# Patient Record
Sex: Male | Born: 1955 | Race: White | Hispanic: No | Smoking: Current some day smoker
Health system: Southern US, Community
[De-identification: ages and names within clinical notes are randomized; demographics above are authoritative.]

## PROBLEM LIST (undated history)

## (undated) DIAGNOSIS — E785 Hyperlipidemia, unspecified: Secondary | ICD-10-CM

## (undated) DIAGNOSIS — D696 Thrombocytopenia, unspecified: Secondary | ICD-10-CM

## (undated) DIAGNOSIS — M1990 Unspecified osteoarthritis, unspecified site: Secondary | ICD-10-CM

## (undated) DIAGNOSIS — F329 Major depressive disorder, single episode, unspecified: Secondary | ICD-10-CM

## (undated) DIAGNOSIS — Z972 Presence of dental prosthetic device (complete) (partial): Secondary | ICD-10-CM

## (undated) DIAGNOSIS — F32A Depression, unspecified: Secondary | ICD-10-CM

## (undated) DIAGNOSIS — F419 Anxiety disorder, unspecified: Secondary | ICD-10-CM

## (undated) DIAGNOSIS — B019 Varicella without complication: Secondary | ICD-10-CM

## (undated) DIAGNOSIS — T7840XA Allergy, unspecified, initial encounter: Secondary | ICD-10-CM

## (undated) DIAGNOSIS — H269 Unspecified cataract: Secondary | ICD-10-CM

## (undated) HISTORY — DX: Unspecified osteoarthritis, unspecified site: M19.90

## (undated) HISTORY — PX: NO PAST SURGERIES: SHX2092

## (undated) HISTORY — DX: Anxiety disorder, unspecified: F41.9

## (undated) HISTORY — DX: Unspecified cataract: H26.9

## (undated) HISTORY — DX: Depression, unspecified: F32.A

## (undated) HISTORY — PX: APPENDECTOMY: SHX54

## (undated) HISTORY — DX: Thrombocytopenia, unspecified: D69.6

## (undated) HISTORY — DX: Hyperlipidemia, unspecified: E78.5

## (undated) HISTORY — DX: Allergy, unspecified, initial encounter: T78.40XA

## (undated) HISTORY — DX: Major depressive disorder, single episode, unspecified: F32.9

## (undated) HISTORY — DX: Varicella without complication: B01.9

---

## 2011-08-15 ENCOUNTER — Ambulatory Visit: Payer: Self-pay | Admitting: Internal Medicine

## 2015-09-01 ENCOUNTER — Other Ambulatory Visit: Payer: Self-pay

## 2015-09-28 ENCOUNTER — Other Ambulatory Visit: Payer: Self-pay | Admitting: Emergency Medicine

## 2015-09-28 DIAGNOSIS — E7800 Pure hypercholesterolemia, unspecified: Secondary | ICD-10-CM

## 2015-09-28 MED ORDER — SIMVASTATIN 10 MG PO TABS: 10.0000 mg | ORAL_TABLET | Freq: Every day | ORAL | Status: DC

## 2015-09-28 NOTE — Telephone Encounter (Signed)
I received a faxed medication request from [facility].  Please advise.  Thanks

## 2015-09-28 NOTE — Telephone Encounter (Signed)
Med refill for simvastatin

## 2015-10-27 ENCOUNTER — Encounter: Payer: Self-pay | Admitting: Physician Assistant

## 2015-10-27 ENCOUNTER — Ambulatory Visit: Payer: Self-pay | Admitting: Physician Assistant

## 2015-10-27 VITALS — BP 120/85 | HR 75 | Temp 98.0°F

## 2015-10-27 DIAGNOSIS — J018 Other acute sinusitis: Secondary | ICD-10-CM

## 2015-10-27 DIAGNOSIS — R7989 Other specified abnormal findings of blood chemistry: Secondary | ICD-10-CM

## 2015-10-27 DIAGNOSIS — E785 Hyperlipidemia, unspecified: Secondary | ICD-10-CM

## 2015-10-27 MED ORDER — FLUTICASONE PROPIONATE 50 MCG/ACT NA SUSP: 2.0000 | Freq: Every day | NASAL | Status: DC

## 2015-10-27 MED ORDER — PREDNISONE 10 MG PO TABS: 30.0000 mg | ORAL_TABLET | Freq: Every day | ORAL | Status: DC

## 2015-10-27 MED ORDER — AZITHROMYCIN 250 MG PO TABS: ORAL_TABLET | ORAL | Status: DC

## 2015-10-27 NOTE — Progress Notes (Signed)
S: C/o runny nose and congestion for 3 days, no fever, chills, cp/sob, v/d; mucus is green and thick, cough is sporadic, c/o of facial and dental pain.   Using otc meds:   O: PE: perrl eomi, normocephalic, tms dull, nasal mucosa red and swollen, throat injected, neck supple no lymph, lungs c t a, cv rrr, neuro intact  A:  Acute sinusitis   P: zpack, flonase, prednisone 30mg  qd x 3d, drink fluids, continue regular meds , use otc meds of choice, return if not improving in 5 days, return earlier if worsening

## 2015-10-27 NOTE — Addendum Note (Signed)
Addended by: Rudene Anda T on: 10/27/2015 02:13 PM   Modules accepted: Orders

## 2015-10-28 ENCOUNTER — Encounter: Payer: Self-pay | Admitting: Emergency Medicine

## 2015-10-28 ENCOUNTER — Other Ambulatory Visit: Payer: Self-pay | Admitting: Physician Assistant

## 2015-10-28 LAB — CBC WITH DIFFERENTIAL/PLATELET
Basophils Absolute: 0 10*3/uL (ref 0.0–0.2)
Basos: 0 %
EOS (ABSOLUTE): 0.2 10*3/uL (ref 0.0–0.4)
EOS: 2 %
HEMATOCRIT: 48.9 % (ref 37.5–51.0)
HEMOGLOBIN: 16.8 g/dL (ref 12.6–17.7)
IMMATURE GRANULOCYTES: 0 %
Immature Grans (Abs): 0 10*3/uL (ref 0.0–0.1)
LYMPHS ABS: 1.6 10*3/uL (ref 0.7–3.1)
Lymphs: 18 %
MCH: 33.7 pg — ABNORMAL HIGH (ref 26.6–33.0)
MCHC: 34.4 g/dL (ref 31.5–35.7)
MCV: 98 fL — ABNORMAL HIGH (ref 79–97)
MONOCYTES: 6 %
Monocytes Absolute: 0.5 10*3/uL (ref 0.1–0.9)
Neutrophils Absolute: 6.6 10*3/uL (ref 1.4–7.0)
Neutrophils: 74 %
Platelets: 138 10*3/uL — ABNORMAL LOW (ref 150–379)
RBC: 4.99 x10E6/uL (ref 4.14–5.80)
RDW: 13.7 % (ref 12.3–15.4)
WBC: 9 10*3/uL (ref 3.4–10.8)

## 2015-10-28 LAB — HEPATIC FUNCTION PANEL
ALT: 19 IU/L (ref 0–44)
AST: 17 IU/L (ref 0–40)
Albumin: 4.9 g/dL — ABNORMAL HIGH (ref 3.6–4.8)
Alkaline Phosphatase: 68 IU/L (ref 39–117)
BILIRUBIN, DIRECT: 0.13 mg/dL (ref 0.00–0.40)
Bilirubin Total: 0.6 mg/dL (ref 0.0–1.2)
TOTAL PROTEIN: 6.7 g/dL (ref 6.0–8.5)

## 2015-10-28 LAB — LIPID PANEL
CHOL/HDL RATIO: 5.8 ratio — AB (ref 0.0–5.0)
CHOLESTEROL TOTAL: 179 mg/dL (ref 100–199)
HDL: 31 mg/dL — ABNORMAL LOW (ref >39)
LDL Calculated: 97 mg/dL (ref 0–99)
Triglycerides: 253 mg/dL — ABNORMAL HIGH (ref 0–149)
VLDL Cholesterol Cal: 51 mg/dL — ABNORMAL HIGH (ref 5–40)

## 2015-10-28 LAB — TESTOSTERONE: Testosterone: 547 ng/dL (ref 348–1197)

## 2015-10-28 LAB — PSA: PROSTATE SPECIFIC AG, SERUM: 1.1 ng/mL (ref 0.0–4.0)

## 2015-10-28 MED ORDER — SIMVASTATIN 20 MG PO TABS: 20.0000 mg | ORAL_TABLET | Freq: Every day | ORAL | Status: DC

## 2015-10-28 NOTE — Progress Notes (Signed)
Information regarding cholesterol control was mailed to patient.

## 2015-12-16 ENCOUNTER — Telehealth: Payer: Self-pay | Admitting: Emergency Medicine

## 2015-12-16 NOTE — Telephone Encounter (Signed)
I spoke with patient's wife Jonathan Ingram), per patient's request of his appointment with the Hematologist at the [facility].  His appointment is scheduled for 12/22/2015 at 3pm.

## 2015-12-22 ENCOUNTER — Inpatient Hospital Stay: Payer: Self-pay

## 2016-01-05 ENCOUNTER — Inpatient Hospital Stay: Payer: Managed Care, Other (non HMO)

## 2016-01-05 ENCOUNTER — Encounter: Payer: Self-pay | Admitting: Internal Medicine

## 2016-01-05 ENCOUNTER — Inpatient Hospital Stay: Payer: Managed Care, Other (non HMO) | Attending: Internal Medicine | Admitting: Internal Medicine

## 2016-01-05 ENCOUNTER — Other Ambulatory Visit: Payer: Self-pay | Admitting: *Deleted

## 2016-01-05 VITALS — BP 137/83 | HR 59 | Temp 96.8°F | Resp 18 | Ht 69.0 in | Wt 190.5 lb

## 2016-01-05 DIAGNOSIS — D696 Thrombocytopenia, unspecified: Secondary | ICD-10-CM

## 2016-01-05 DIAGNOSIS — F17210 Nicotine dependence, cigarettes, uncomplicated: Secondary | ICD-10-CM | POA: Diagnosis not present

## 2016-01-05 DIAGNOSIS — E785 Hyperlipidemia, unspecified: Secondary | ICD-10-CM | POA: Diagnosis not present

## 2016-01-05 DIAGNOSIS — Z79899 Other long term (current) drug therapy: Secondary | ICD-10-CM | POA: Diagnosis not present

## 2016-01-05 DIAGNOSIS — E291 Testicular hypofunction: Secondary | ICD-10-CM | POA: Diagnosis not present

## 2016-01-05 LAB — COMPREHENSIVE METABOLIC PANEL WITH GFR
ALT: 23 U/L (ref 17–63)
AST: 20 U/L (ref 15–41)
Albumin: 4.6 g/dL (ref 3.5–5.0)
Alkaline Phosphatase: 65 U/L (ref 38–126)
Anion gap: 8 (ref 5–15)
BUN: 19 mg/dL (ref 6–20)
CO2: 27 mmol/L (ref 22–32)
Calcium: 9.1 mg/dL (ref 8.9–10.3)
Chloride: 101 mmol/L (ref 101–111)
Creatinine, Ser: 1.02 mg/dL (ref 0.61–1.24)
GFR calc Af Amer: >60 mL/min
GFR calc non Af Amer: >60 mL/min
Glucose, Bld: 97 mg/dL (ref 65–99)
Potassium: 4.2 mmol/L (ref 3.5–5.1)
Sodium: 136 mmol/L (ref 135–145)
Total Bilirubin: 0.9 mg/dL (ref 0.3–1.2)
Total Protein: 6.8 g/dL (ref 6.5–8.1)

## 2016-01-05 LAB — CBC WITH DIFFERENTIAL/PLATELET
Basophils Absolute: 0 10*3/uL (ref 0–0.1)
Basophils Relative: 1 %
Eosinophils Absolute: 0.2 10*3/uL (ref 0–0.7)
Eosinophils Relative: 4 %
HCT: 47.5 % (ref 40.0–52.0)
Hemoglobin: 16.6 g/dL (ref 13.0–18.0)
Lymphocytes Relative: 36 %
Lymphs Abs: 1.8 10*3/uL (ref 1.0–3.6)
MCH: 34.1 pg — ABNORMAL HIGH (ref 26.0–34.0)
MCHC: 35 g/dL (ref 32.0–36.0)
MCV: 97.6 fL (ref 80.0–100.0)
Monocytes Absolute: 0.4 10*3/uL (ref 0.2–1.0)
Monocytes Relative: 7 %
Neutro Abs: 2.6 10*3/uL (ref 1.4–6.5)
Neutrophils Relative %: 52 %
Platelets: 134 10*3/uL — ABNORMAL LOW (ref 150–440)
RBC: 4.86 MIL/uL (ref 4.40–5.90)
RDW: 13 % (ref 11.5–14.5)
WBC: 4.9 10*3/uL (ref 3.8–10.6)

## 2016-01-05 LAB — VITAMIN B12: Vitamin B-12: 436 pg/mL (ref 180–914)

## 2016-01-05 LAB — LACTATE DEHYDROGENASE: LDH: 124 U/L (ref 98–192)

## 2016-01-05 LAB — FOLATE: FOLATE: 18.9 ng/mL (ref >5.9)

## 2016-01-05 NOTE — Progress Notes (Signed)
[place] NOTE  Patient Care Team: Lynnell Jude, MD as PCP - General (Family Medicine)  CHIEF COMPLAINTS/PURPOSE OF CONSULTATION:  Thrombocytopenia  # NOV 2016- THROMBOCYTOPENIA- ~ platelets 138/normal white count hemoglobin [incidental]  HISTORY OF PRESENTING ILLNESS:  Jonathan Ingram 60 y.o.  male with no significant past medical history except for hyperlipidemia/low testosterone- is here to discuss low platelets that was found on incidental labs. Mild thrombocytopenia was initially noted in October 2016; which are the follow-up lab in end of November 2016 was around 138.  Patient denies any nosebleeds or gum bleeding. He denies any unusual weight loss or night sweats or Fevers. No unusual shortness of breath or cough. No chest pain no unusual skin rash.   He denies any herbal medication; denies any alcohol abuse. He drinks a few beers over the weekend.    ROS: A complete 10 point review of system is done which is negative except mentioned above in history of present illness  MEDICAL HISTORY:  Past Medical History  Diagnosis Date  . Allergy   . Anxiety     SURGICAL HISTORY: History reviewed. No pertinent past surgical history.  SOCIAL HISTORY: Social History   Social History  . Marital Status: Married    Spouse Name: N/A  . Number of Children: N/A  . Years of Education: N/A   Occupational History  . Not on file.   Social History Main Topics  . Smoking status: Current Some Day Smoker -- 0.50 packs/day for 42 years  . Smokeless tobacco: Not on file  . Alcohol Use: 0.0 oz/week    0 Standard drinks or equivalent per week  . Drug Use: Not on file  . Sexual Activity: Not on file   Other Topics Concern  . Not on file   Social History Narrative    FAMILY HISTORY: Family History  Problem Relation Age of Onset  . Hypertension Mother   . Cancer Mother   . Clotting disorder Mother   . Clotting disorder Brother   . Hypertension Maternal  Uncle   . Cancer Maternal Uncle   . Cancer Paternal Uncle   . Cancer Maternal Grandmother     ALLERGIES:  is allergic to penicillins.  MEDICATIONS:  Current Outpatient Prescriptions  Medication Sig Dispense Refill  . ALPRAZolam (XANAX) 0.5 MG tablet Take 0.5 mg by mouth at bedtime as needed for anxiety.    . clomiPHENE (CLOMID) 50 MG tablet   0  . fluticasone (FLONASE) 50 MCG/ACT nasal spray Place 2 sprays into both nostrils daily. 16 g 6  . simvastatin (ZOCOR) 20 MG tablet Take 1 tablet (20 mg total) by mouth daily. 30 tablet 4   No current facility-administered medications for this visit.      Marland Kitchen  PHYSICAL EXAMINATION: ECOG PERFORMANCE STATUS: 0 - Asymptomatic  Filed Vitals:   01/05/16 0925  BP: 137/83  Pulse: 59  Temp: 96.8 F (36 C)  Resp: 18   Filed Weights   01/05/16 0922 01/05/16 0925  Weight: 190 lb 7.6 oz (86.4 kg) 190 lb 7.6 oz (86.4 kg)    GENERAL: Well-nourished well-developed; Alert, no distress and comfortable.   Accompanied by wife.  EYES: no pallor or icterus OROPHARYNX: no thrush or ulceration; good dentition  NECK: supple, no masses felt LYMPH:  no palpable lymphadenopathy in the cervical, axillary or inguinal regions LUNGS: clear to auscultation and  No wheeze or crackles HEART/CVS: regular rate & rhythm and no murmurs; No lower extremity edema  ABDOMEN: abdomen soft, non-tender and normal bowel sounds Musculoskeletal:no cyanosis of digits and no clubbing  PSYCH: alert & oriented x 3 with fluent speech NEURO: no focal motor/sensory deficits SKIN:  no rashes or significant lesions  LABORATORY DATA:  I have reviewed the data as listed Lab Results  Component Value Date   WBC 9.0 10/27/2015   HCT 48.9 10/27/2015   MCV 98* 10/27/2015   PLT 138* 10/27/2015    Recent Labs  10/27/15 0850  PROT 6.7  ALBUMIN 4.9*  AST 17  ALT 19  ALKPHOS 68  BILITOT 0.6  BILIDIR 0.13     ASSESSMENT & PLAN:   # Thrombocytopenia: Platelets and 130s on 2  separate occasions as per the patient. Rest of the CBC within normal limits. The etiology is unclear- however the differential includes benign versus malignant causes. Recommend checking CBC CMP and LDH 123456 folic acid; hepatitis workup. Based upon the above blood work we'll plan either further workup with imaging like ultrasound of the abdomen.   # Smoking- discussed the potential health issues related with smoking including lung and heart diseases and cancers. Patient was not too keen on quitting. He also discussed regarding lung cancer screening Visit.  # Patient will follow-up with me in approximately 3 weeks.   # 30 minutes face-to-face with the patient discussing the above plan of care; more than 50% of time spent on counseling and coordination.  Thank you Ms.Fischer  for allowing me to participate in the care of your pleasant patient. Please do not hesitate to contact me with questions or concerns in the interim.       Cammie Sickle, MD 01/05/2016 9:33 AM

## 2016-01-05 NOTE — Patient Instructions (Signed)
Smoking Cessation, Tips for Success If you are ready to quit smoking, congratulations! You have chosen to help yourself be healthier. Cigarettes bring nicotine, tar, carbon monoxide, and other irritants into your body. Your lungs, heart, and blood vessels will be able to work better without these poisons. There are many different ways to quit smoking. Nicotine gum, nicotine patches, a nicotine inhaler, or nicotine nasal spray can help with physical craving. Hypnosis, support groups, and medicines help break the habit of smoking. WHAT THINGS CAN I DO TO MAKE QUITTING EASIER?  Here are some tips to help you quit for good:  Pick a date when you will quit smoking completely. Tell all of your friends and family about your plan to quit on that date.  Do not try to slowly cut down on the number of cigarettes you are smoking. Pick a quit date and quit smoking completely starting on that day.  Throw away all cigarettes.   Clean and remove all ashtrays from your home, work, and car.  On a card, write down your reasons for quitting. Carry the card with you and read it when you get the urge to smoke.  Cleanse your body of nicotine. Drink enough water and fluids to keep your urine clear or pale yellow. Do this after quitting to flush the nicotine from your body.  Learn to predict your moods. Do not let a bad situation be your excuse to have a cigarette. Some situations in your life might tempt you into wanting a cigarette.  Never have "just one" cigarette. It leads to wanting another and another. Remind yourself of your decision to quit.  Change habits associated with smoking. If you smoked while driving or when feeling stressed, try other activities to replace smoking. Stand up when drinking your coffee. Brush your teeth after eating. Sit in a different chair when you read the paper. Avoid alcohol while trying to quit, and try to drink fewer caffeinated beverages. Alcohol and caffeine may urge you to  smoke.  Avoid foods and drinks that can trigger a desire to smoke, such as sugary or spicy foods and alcohol.  Ask people who smoke not to smoke around you.  Have something planned to do right after eating or having a cup of coffee. For example, plan to take a walk or exercise.  Try a relaxation exercise to calm you down and decrease your stress. Remember, you may be tense and nervous for the first 2 weeks after you quit, but this will pass.  Find new activities to keep your hands busy. Play with a pen, coin, or rubber band. Doodle or draw things on paper.  Brush your teeth right after eating. This will help cut down on the craving for the taste of tobacco after meals. You can also try mouthwash.   Use oral substitutes in place of cigarettes. Try using lemon drops, carrots, cinnamon sticks, or chewing gum. Keep them handy so they are available when you have the urge to smoke.  When you have the urge to smoke, try deep breathing.  Designate your home as a nonsmoking area.  If you are a heavy smoker, ask your health care provider about a prescription for nicotine chewing gum. It can ease your withdrawal from nicotine.  Reward yourself. Set aside the cigarette money you save and buy yourself something nice.  Look for support from others. Join a support group or smoking cessation program. Ask someone at home or at work to help you with your plan   to quit smoking.  Always ask yourself, "Do I need this cigarette or is this just a reflex?" Tell yourself, "Today, I choose not to smoke," or "I do not want to smoke." You are reminding yourself of your decision to quit.  Do not replace cigarette smoking with electronic cigarettes (commonly called e-cigarettes). The safety of e-cigarettes is unknown, and some may contain harmful chemicals.  If you relapse, do not give up! Plan ahead and think about what you will do the next time you get the urge to smoke. HOW WILL I FEEL WHEN I QUIT SMOKING? You  may have symptoms of withdrawal because your body is used to nicotine (the addictive substance in cigarettes). You may crave cigarettes, be irritable, feel very hungry, cough often, get headaches, or have difficulty concentrating. The withdrawal symptoms are only temporary. They are strongest when you first quit but will go away within 10-14 days. When withdrawal symptoms occur, stay in control. Think about your reasons for quitting. Remind yourself that these are signs that your body is healing and getting used to being without cigarettes. Remember that withdrawal symptoms are easier to treat than the major diseases that smoking can cause.  Even after the withdrawal is over, expect periodic urges to smoke. However, these cravings are generally short lived and will go away whether you smoke or not. Do not smoke! WHAT RESOURCES ARE AVAILABLE TO HELP ME QUIT SMOKING? Your health care provider can direct you to community resources or hospitals for support, which may include:  Group support.  Education.  Hypnosis.  Therapy.   This information is not intended to replace advice given to you by your health care provider. Make sure you discuss any questions you have with your health care provider.   Document Released: 08/11/2004 Document Revised: 12/04/2014 Document Reviewed: 05/01/2013 Elsevier Interactive Patient Education 2016 Elsevier Inc.  

## 2016-01-06 ENCOUNTER — Telehealth: Payer: Self-pay | Admitting: *Deleted

## 2016-01-06 ENCOUNTER — Other Ambulatory Visit: Payer: Self-pay | Admitting: Internal Medicine

## 2016-01-06 DIAGNOSIS — R161 Splenomegaly, not elsewhere classified: Secondary | ICD-10-CM

## 2016-01-06 DIAGNOSIS — D696 Thrombocytopenia, unspecified: Secondary | ICD-10-CM | POA: Insufficient documentation

## 2016-01-06 LAB — HEPATITIS B CORE ANTIBODY, IGM: HEP B C IGM: NEGATIVE

## 2016-01-06 LAB — HEPATITIS C ANTIBODY

## 2016-01-06 LAB — HEPATITIS B SURFACE ANTIGEN: HEP B S AG: NEGATIVE

## 2016-01-06 NOTE — Telephone Encounter (Signed)
Wife called back [phone] (work number) or her cell (at [phone]).  I explained the need for the ultrasound and results.  I will have scheduling set up the u/s and call the wife with the appointment details.

## 2016-01-06 NOTE — Telephone Encounter (Signed)
Left msg for patient at [phone]. Unable to leave msg with patient's wife as not an active working number.

## 2016-01-06 NOTE — Telephone Encounter (Signed)
-----   Message from Cammie Sickle, MD sent at 01/06/2016 11:07 AM EST ----- Please inform the patient's wife that he needs to have his ultrasound done before the next visit.so far no clear etiology found for his slightly low platelets. Thx

## 2016-01-10 ENCOUNTER — Ambulatory Visit
Admission: RE | Admit: 2016-01-10 | Discharge: 2016-01-10 | Disposition: A | Discharge status: Home / Self Care | Payer: Managed Care, Other (non HMO) | Source: Ambulatory Visit | Attending: Internal Medicine | Admitting: Internal Medicine

## 2016-01-10 DIAGNOSIS — D696 Thrombocytopenia, unspecified: Secondary | ICD-10-CM | POA: Diagnosis present

## 2016-01-10 DIAGNOSIS — R161 Splenomegaly, not elsewhere classified: Secondary | ICD-10-CM | POA: Insufficient documentation

## 2016-01-10 DIAGNOSIS — K760 Fatty (change of) liver, not elsewhere classified: Secondary | ICD-10-CM | POA: Diagnosis not present

## 2016-01-13 ENCOUNTER — Encounter: Payer: Self-pay | Admitting: Internal Medicine

## 2016-01-13 NOTE — Progress Notes (Signed)
RN contacted patient's wife, Hilda Blades. U/S demonstrated a Fatty Liver, but no other acute abnormalities. I explained that his primary care and/or a gastric md could offer suggestions on treating the fatty liver. However, I did encourage dietary and lifestyle modifications. I will also send the patient's a reply through his mychart.

## 2016-01-26 ENCOUNTER — Inpatient Hospital Stay: Payer: Managed Care, Other (non HMO) | Attending: Internal Medicine | Admitting: Internal Medicine

## 2016-01-26 VITALS — BP 122/78 | HR 66 | Temp 96.5°F | Resp 18 | Ht 69.0 in | Wt 190.3 lb

## 2016-01-26 DIAGNOSIS — Z880 Allergy status to penicillin: Secondary | ICD-10-CM | POA: Insufficient documentation

## 2016-01-26 DIAGNOSIS — K760 Fatty (change of) liver, not elsewhere classified: Secondary | ICD-10-CM | POA: Diagnosis not present

## 2016-01-26 DIAGNOSIS — Z79899 Other long term (current) drug therapy: Secondary | ICD-10-CM | POA: Diagnosis not present

## 2016-01-26 DIAGNOSIS — D696 Thrombocytopenia, unspecified: Secondary | ICD-10-CM | POA: Diagnosis present

## 2016-01-26 DIAGNOSIS — F17210 Nicotine dependence, cigarettes, uncomplicated: Secondary | ICD-10-CM | POA: Diagnosis not present

## 2016-01-26 NOTE — Progress Notes (Signed)
[place] NOTE  Patient Care Team: Lynnell Jude, MD as PCP - General (Family Medicine)  CHIEF COMPLAINTS/PURPOSE OF CONSULTATION:  Thrombocytopenia  # NOV 2016- THROMBOCYTOPENIA- ~ platelets 138/normal white count hemoglobin [incidental]  HISTORY OF PRESENTING ILLNESS:  Jonathan Ingram 60 y.o.  male with  thrombocytopenia is here to review the results of his blood work/ ultrasound.    He continues to deny any bleeding gums her nasal bleeding.  He drinks a few beers over the weekend.   ROS: A complete 10 point review of system is done which is negative except mentioned above in history of present illness  MEDICAL HISTORY:  Past Medical History  Diagnosis Date  . Allergy   . Anxiety   . Thrombocytopenia ([place])     SURGICAL HISTORY: No past surgical history on file.  SOCIAL HISTORY: Social History   Social History  . Marital Status: Married    Spouse Name: N/A  . Number of Children: N/A  . Years of Education: N/A   Occupational History  . Not on file.   Social History Main Topics  . Smoking status: Current Some Day Smoker -- 0.50 packs/day for 42 years    Types: Cigarettes  . Smokeless tobacco: Never Used  . Alcohol Use: 0.6 oz/week    1 Standard drinks or equivalent per week     Comment: "occassional beer on the weekend when I watch a ball game"  . Drug Use: No  . Sexual Activity: Not on file   Other Topics Concern  . Not on file   Social History Narrative    FAMILY HISTORY: Family History  Problem Relation Age of Onset  . Hypertension Mother   . Bladder Cancer Mother   . Clotting disorder Mother   . Clotting disorder Brother   . Hypertension Maternal Uncle   . Bladder Cancer Maternal Uncle   . Leukemia Paternal Uncle   . Liver cancer Maternal Grandmother     ALLERGIES:  is allergic to penicillins.  MEDICATIONS:  Current Outpatient Prescriptions  Medication Sig Dispense Refill  . ALPRAZolam (XANAX) 0.5 MG tablet Take 0.5  mg by mouth at bedtime as needed for anxiety.    . clomiPHENE (CLOMID) 50 MG tablet   0  . fluticasone (FLONASE) 50 MCG/ACT nasal spray Place 2 sprays into both nostrils daily. 16 g 6  . simvastatin (ZOCOR) 20 MG tablet Take 1 tablet (20 mg total) by mouth daily. 30 tablet 4   No current facility-administered medications for this visit.      Marland Kitchen  PHYSICAL EXAMINATION: ECOG PERFORMANCE STATUS: 0 - Asymptomatic  Filed Vitals:   01/26/16 0924  BP: 122/78  Pulse: 66  Temp: 96.5 F (35.8 C)  Resp: 18   Filed Weights   01/26/16 0924  Weight: 190 lb 4.1 oz (86.3 kg)    GENERAL: Well-nourished well-developed; Alert, no distress and comfortable.   Accompanied by wife.    LABORATORY DATA:  I have reviewed the data as listed Lab Results  Component Value Date   WBC 4.9 01/05/2016   HGB 16.6 01/05/2016   HCT 47.5 01/05/2016   MCV 97.6 01/05/2016   PLT 134* 01/05/2016    Recent Labs  10/27/15 0850 01/05/16 1018  NA  --  136  K  --  4.2  CL  --  101  CO2  --  27  GLUCOSE  --  97  BUN  --  19  CREATININE  --  1.02  CALCIUM  --  9.1  GFRNONAA  --  >60  GFRAA  --  >60  PROT 6.7 6.8  ALBUMIN 4.9* 4.6  AST 17 20  ALT 19 23  ALKPHOS 68 65  BILITOT 0.6 0.9  BILIDIR 0.13  --      ASSESSMENT & PLAN:   # Thrombocytopenia:  Isolated thrombocytopenia platelets 134.  No clear etiology noted.  Question ITP versus primary bone marrow process.  Since the patient is asymptomatic-  I recommend surveillance at this time.  #  Smoking-  Patient stated that his treatment he quit smoking.  His not interested in lung cancer screening program.  #  Fatty liver noted on the ultrasound-  Recommend low-fat diet/ exercise.  #  I would recommend monitoring the CBC in 6 weeks;  In follow-up with CBC/ me in 3 months.      Cammie Sickle, MD 01/26/2016 9:38 AM

## 2016-01-26 NOTE — Progress Notes (Signed)
Pt here to get results of labs and u/s. No problems/

## 2016-03-08 ENCOUNTER — Inpatient Hospital Stay: Payer: Managed Care, Other (non HMO) | Attending: Internal Medicine

## 2016-03-20 ENCOUNTER — Telehealth: Payer: Self-pay | Admitting: *Deleted

## 2016-03-20 NOTE — Telephone Encounter (Signed)
Patient's wife, Hilda Blades called asking of we can fax over an order to [place] for labs patient missed in April?  Asked that order be faxed to [facility] @ [phone].  Hilda Blades states Beckie Busing will contact them with appt date and time.  After looking in [facility] only a CBC was ordered.  Called Monique and left message as to why we need to fax an order when the order is already in [facility].  Instructed her to call back to [phone] in the even she actually does need order faxed.

## 2016-03-27 ENCOUNTER — Other Ambulatory Visit: Payer: Self-pay

## 2016-03-31 ENCOUNTER — Ambulatory Visit: Payer: Self-pay | Admitting: Physician Assistant

## 2016-03-31 ENCOUNTER — Encounter: Payer: Self-pay | Admitting: Physician Assistant

## 2016-03-31 VITALS — BP 100/70 | HR 63 | Temp 97.7°F

## 2016-03-31 DIAGNOSIS — E785 Hyperlipidemia, unspecified: Secondary | ICD-10-CM

## 2016-03-31 DIAGNOSIS — F329 Major depressive disorder, single episode, unspecified: Secondary | ICD-10-CM

## 2016-03-31 DIAGNOSIS — R7989 Other specified abnormal findings of blood chemistry: Secondary | ICD-10-CM

## 2016-03-31 DIAGNOSIS — G4700 Insomnia, unspecified: Secondary | ICD-10-CM

## 2016-03-31 DIAGNOSIS — M1990 Unspecified osteoarthritis, unspecified site: Secondary | ICD-10-CM

## 2016-03-31 DIAGNOSIS — F32A Depression, unspecified: Secondary | ICD-10-CM

## 2016-03-31 MED ORDER — CLOMIPHENE CITRATE 50 MG PO TABS: ORAL_TABLET | ORAL | Status: DC

## 2016-03-31 MED ORDER — TRAZODONE HCL 150 MG PO TABS: 150.0000 mg | ORAL_TABLET | Freq: Every evening | ORAL | Status: DC | PRN

## 2016-03-31 MED ORDER — MELOXICAM 15 MG PO TABS: 15.0000 mg | ORAL_TABLET | Freq: Every day | ORAL | Status: DC

## 2016-03-31 NOTE — Progress Notes (Signed)
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

## 2016-03-31 NOTE — Progress Notes (Signed)
Patient ID: Jonathan Ingram, male   DOB: 07-11-1956, 60 y.o.   MRN: [id number] Patient has been referred to Jennette Kettle, MD at [facility] [address] Appt is 04/10/16 at 8am.  Patient has been notified.

## 2016-04-03 ENCOUNTER — Other Ambulatory Visit: Payer: Self-pay | Admitting: Physician Assistant

## 2016-04-04 NOTE — Telephone Encounter (Signed)
Med refill approved, recent liver enzymes wnl

## 2016-04-06 LAB — CMP12+LP+TP+TSH+6AC+PSA+CBC…
ALT: 24 IU/L (ref 0–44)
AST: 21 IU/L (ref 0–40)
Albumin/Globulin Ratio: 2.3 — ABNORMAL HIGH (ref 1.2–2.2)
Albumin: 4.6 g/dL (ref 3.6–4.8)
Alkaline Phosphatase: 57 IU/L (ref 39–117)
BUN/Creatinine Ratio: 17 (ref 10–24)
BUN: 18 mg/dL (ref 8–27)
Basophils Absolute: 0 x10E3/uL (ref 0.0–0.2)
Basos: 0 %
Bilirubin Total: 0.6 mg/dL (ref 0.0–1.2)
Calcium: 9.3 mg/dL (ref 8.6–10.2)
Chloride: 102 mmol/L (ref 96–106)
Chol/HDL Ratio: 5 ratio (ref 0.0–5.0)
Cholesterol, Total: 154 mg/dL (ref 100–199)
Creatinine, Ser: 1.07 mg/dL (ref 0.76–1.27)
EOS (ABSOLUTE): 0.2 x10E3/uL (ref 0.0–0.4)
Eos: 3 %
Estimated CHD Risk: 1 " times avg." (ref 0.0–1.0)
Free Thyroxine Index: 1.9 (ref 1.2–4.9)
GFR calc Af Amer: 87 mL/min/1.73
GFR calc non Af Amer: 75 mL/min/1.73
GGT: 23 IU/L (ref 0–65)
Globulin, Total: 2 g/dL (ref 1.5–4.5)
Glucose: 107 mg/dL — ABNORMAL HIGH (ref 65–99)
HDL: 31 mg/dL — ABNORMAL LOW
Hematocrit: 48.7 % (ref 37.5–51.0)
Hemoglobin: 16.8 g/dL (ref 12.6–17.7)
Immature Grans (Abs): 0 x10E3/uL (ref 0.0–0.1)
Immature Granulocytes: 0 %
Iron: 86 ug/dL (ref 38–169)
LDH: 141 IU/L (ref 121–224)
LDL Calculated: 85 mg/dL (ref 0–99)
Lymphocytes Absolute: 1.7 x10E3/uL (ref 0.7–3.1)
Lymphs: 35 %
MCH: 33.3 pg — ABNORMAL HIGH (ref 26.6–33.0)
MCHC: 34.5 g/dL (ref 31.5–35.7)
MCV: 97 fL (ref 79–97)
Monocytes Absolute: 0.4 x10E3/uL (ref 0.1–0.9)
Monocytes: 8 %
Neutrophils Absolute: 2.6 x10E3/uL (ref 1.4–7.0)
Neutrophils: 54 %
Phosphorus: 3.1 mg/dL (ref 2.5–4.5)
Platelets: 138 x10E3/uL — ABNORMAL LOW (ref 150–379)
Potassium: 4.8 mmol/L (ref 3.5–5.2)
Prostate Specific Ag, Serum: 1 ng/mL (ref 0.0–4.0)
RBC: 5.04 x10E6/uL (ref 4.14–5.80)
RDW: 14.2 % (ref 12.3–15.4)
Sodium: 142 mmol/L (ref 134–144)
T3 Uptake Ratio: 33 % (ref 24–39)
T4, Total: 5.9 ug/dL (ref 4.5–12.0)
TSH: 4.02 u[IU]/mL (ref 0.450–4.500)
Total Protein: 6.6 g/dL (ref 6.0–8.5)
Triglycerides: 189 mg/dL — ABNORMAL HIGH (ref 0–149)
Uric Acid: 4.4 mg/dL (ref 3.7–8.6)
VLDL Cholesterol Cal: 38 mg/dL (ref 5–40)
WBC: 4.8 x10E3/uL (ref 3.4–10.8)

## 2016-04-06 LAB — TESTOSTERONE,FREE AND TOTAL
TESTOSTERONE FREE: 8.4 pg/mL (ref 6.6–18.1)
TESTOSTERONE: 352 ng/dL (ref 348–1197)

## 2016-04-06 LAB — VITAMIN D 25 HYDROXY (VIT D DEFICIENCY, FRACTURES): Vit D, 25-Hydroxy: 35.9 ng/mL (ref 30.0–100.0)

## 2016-04-10 ENCOUNTER — Encounter: Payer: Self-pay | Admitting: Family Medicine

## 2016-04-10 ENCOUNTER — Ambulatory Visit (INDEPENDENT_AMBULATORY_CARE_PROVIDER_SITE_OTHER): Payer: Managed Care, Other (non HMO) | Admitting: Family Medicine

## 2016-04-10 VITALS — BP 116/84 | HR 63 | Temp 97.8°F | Ht 69.25 in | Wt 187.5 lb

## 2016-04-10 DIAGNOSIS — F419 Anxiety disorder, unspecified: Secondary | ICD-10-CM

## 2016-04-10 DIAGNOSIS — R6889 Other general symptoms and signs: Secondary | ICD-10-CM | POA: Diagnosis not present

## 2016-04-10 DIAGNOSIS — E291 Testicular hypofunction: Secondary | ICD-10-CM

## 2016-04-10 DIAGNOSIS — Z23 Encounter for immunization: Secondary | ICD-10-CM

## 2016-04-10 DIAGNOSIS — Z0001 Encounter for general adult medical examination with abnormal findings: Secondary | ICD-10-CM

## 2016-04-10 DIAGNOSIS — E785 Hyperlipidemia, unspecified: Secondary | ICD-10-CM

## 2016-04-10 DIAGNOSIS — F17200 Nicotine dependence, unspecified, uncomplicated: Secondary | ICD-10-CM | POA: Insufficient documentation

## 2016-04-10 DIAGNOSIS — D696 Thrombocytopenia, unspecified: Secondary | ICD-10-CM

## 2016-04-10 DIAGNOSIS — Z0000 Encounter for general adult medical examination without abnormal findings: Secondary | ICD-10-CM | POA: Diagnosis not present

## 2016-04-10 DIAGNOSIS — F17210 Nicotine dependence, cigarettes, uncomplicated: Secondary | ICD-10-CM | POA: Insufficient documentation

## 2016-04-10 DIAGNOSIS — R7989 Other specified abnormal findings of blood chemistry: Secondary | ICD-10-CM | POA: Insufficient documentation

## 2016-04-10 DIAGNOSIS — G4700 Insomnia, unspecified: Secondary | ICD-10-CM | POA: Insufficient documentation

## 2016-04-10 MED ORDER — ALPRAZOLAM 0.5 MG PO TABS: 0.5000 mg | ORAL_TABLET | Freq: Every evening | ORAL | Status: DC | PRN

## 2016-04-10 NOTE — Progress Notes (Signed)
Pre visit review using our clinic review tool, if applicable. No additional management support is needed unless otherwise documented below in the visit note. 

## 2016-04-10 NOTE — Patient Instructions (Signed)
Continue your current medication.  Try and cut back on your smoking.  Follow up in 6 months. Let me know if you would like to see Urology regarding your testosterone.  Call you insurance company about the Cologuard.  Take care  Dr. Lacinda Axon   Health Maintenance, Male A healthy lifestyle and preventative care can promote health and wellness.  Maintain regular health, dental, and eye exams.  Eat a healthy diet. Foods like vegetables, fruits, whole grains, low-fat dairy products, and lean protein foods contain the nutrients you need and are low in calories. Decrease your intake of foods high in solid fats, added sugars, and salt. Get information about a proper diet from your health care provider, if necessary.  Regular physical exercise is one of the most important things you can do for your health. Most adults should get at least 150 minutes of moderate-intensity exercise (any activity that increases your heart rate and causes you to sweat) each week. In addition, most adults need muscle-strengthening exercises on 2 or more days a week.   Maintain a healthy weight. The body mass index (BMI) is a screening tool to identify possible weight problems. It provides an estimate of body fat based on height and weight. Your health care provider can find your BMI and can help you achieve or maintain a healthy weight. For males 20 years and older:  A BMI below 18.5 is considered underweight.  A BMI of 18.5 to 24.9 is normal.  A BMI of 25 to 29.9 is considered overweight.  A BMI of 30 and above is considered obese.  Maintain normal blood lipids and cholesterol by exercising and minimizing your intake of saturated fat. Eat a balanced diet with plenty of fruits and vegetables. Blood tests for lipids and cholesterol should begin at age 34 and be repeated every 5 years. If your lipid or cholesterol levels are high, you are over age 39, or you are at high risk for heart disease, you may need your  cholesterol levels checked more frequently.Ongoing high lipid and cholesterol levels should be treated with medicines if diet and exercise are not working.  If you smoke, find out from your health care provider how to quit. If you do not use tobacco, do not start.  Lung cancer screening is recommended for adults aged 77-80 years who are at high risk for developing lung cancer because of a history of smoking. A yearly low-dose CT scan of the lungs is recommended for people who have at least a 30-pack-year history of smoking and are current smokers or have quit within the past 15 years. A pack year of smoking is smoking an average of 1 pack of cigarettes a day for 1 year (for example, a 30-pack-year history of smoking could mean smoking 1 pack a day for 30 years or 2 packs a day for 15 years). Yearly screening should continue until the smoker has stopped smoking for at least 15 years. Yearly screening should be stopped for people who develop a health problem that would prevent them from having lung cancer treatment.  If you choose to drink alcohol, do not have more than 2 drinks per day. One drink is considered to be 12 oz (360 mL) of beer, 5 oz (150 mL) of wine, or 1.5 oz (45 mL) of liquor.  Avoid the use of street drugs. Do not share needles with anyone. Ask for help if you need support or instructions about stopping the use of drugs.  High blood pressure causes  heart disease and increases the risk of stroke. High blood pressure is more likely to develop in:  People who have blood pressure in the end of the normal range (100-139/85-89 mm Hg).  People who are overweight or obese.  People who are African American.  If you are 51-78 years of age, have your blood pressure checked every 3-5 years. If you are 48 years of age or older, have your blood pressure checked every year. You should have your blood pressure measured twice--once when you are at a hospital or clinic, and once when you are not at a  hospital or clinic. Record the average of the two measurements. To check your blood pressure when you are not at a hospital or clinic, you can use:  An automated blood pressure machine at a pharmacy.  A home blood pressure monitor.  If you are 62-2 years old, ask your health care provider if you should take aspirin to prevent heart disease.  Diabetes screening involves taking a blood sample to check your fasting blood sugar level. This should be done once every 3 years after age 2 if you are at a normal weight and without risk factors for diabetes. Testing should be considered at a younger age or be carried out more frequently if you are overweight and have at least 1 risk factor for diabetes.  Colorectal cancer can be detected and often prevented. Most routine colorectal cancer screening begins at the age of 34 and continues through age 1. However, your health care provider may recommend screening at an earlier age if you have risk factors for colon cancer. On a yearly basis, your health care provider may provide home test kits to check for hidden blood in the stool. A small camera at the end of a tube may be used to directly examine the colon (sigmoidoscopy or colonoscopy) to detect the earliest forms of colorectal cancer. Talk to your health care provider about this at age 60 when routine screening begins. A direct exam of the colon should be repeated every 5-10 years through age 35, unless early forms of precancerous polyps or small growths are found.  People who are at an increased risk for hepatitis B should be screened for this virus. You are considered at high risk for hepatitis B if:  You were born in a country where hepatitis B occurs often. Talk with your health care provider about which countries are considered high risk.  Your parents were born in a high-risk country and you have not received a shot to protect against hepatitis B (hepatitis B vaccine).  You have HIV or AIDS.  You  use needles to inject street drugs.  You live with, or have sex with, someone who has hepatitis B.  You are a man who has sex with other men (MSM).  You get hemodialysis treatment.  You take certain medicines for conditions like cancer, organ transplantation, and autoimmune conditions.  Hepatitis C blood testing is recommended for all people born from [birth year] through 1965 and any individual with known risk factors for hepatitis C.  Healthy men should no longer receive prostate-specific antigen (PSA) blood tests as part of routine cancer screening. Talk to your health care provider about prostate cancer screening.  Testicular cancer screening is not recommended for adolescents or adult males who have no symptoms. Screening includes self-exam, a health care provider exam, and other screening tests. Consult with your health care provider about any symptoms you have or any concerns you have about  testicular cancer.  Practice safe sex. Use condoms and avoid high-risk sexual practices to reduce the spread of sexually transmitted infections (STIs).  You should be screened for STIs, including gonorrhea and chlamydia if:  You are sexually active and are younger than 24 years.  You are older than 24 years, and your health care provider tells you that you are at risk for this type of infection.  Your sexual activity has changed since you were last screened, and you are at an increased risk for chlamydia or gonorrhea. Ask your health care provider if you are at risk.  If you are at risk of being infected with HIV, it is recommended that you take a prescription medicine daily to prevent HIV infection. This is called pre-exposure prophylaxis (PrEP). You are considered at risk if:  You are a man who has sex with other men (MSM).  You are a heterosexual man who is sexually active with multiple partners.  You take drugs by injection.  You are sexually active with a partner who has HIV.  Talk with  your health care provider about whether you are at high risk of being infected with HIV. If you choose to begin PrEP, you should first be tested for HIV. You should then be tested every 3 months for as long as you are taking PrEP.  Use sunscreen. Apply sunscreen liberally and repeatedly throughout the day. You should seek shade when your shadow is shorter than you. Protect yourself by wearing long sleeves, pants, a wide-brimmed hat, and sunglasses year round whenever you are outdoors.  Tell your health care provider of new moles or changes in moles, especially if there is a change in shape or color. Also, tell your health care provider if a mole is larger than the size of a pencil eraser.  A one-time screening for abdominal aortic aneurysm (AAA) and surgical repair of large AAAs by ultrasound is recommended for men aged 34-75 years who are current or former smokers.  Stay current with your vaccines (immunizations).   This information is not intended to replace advice given to you by your health care provider. Make sure you discuss any questions you have with your health care provider.   Document Released: 05/11/2008 Document Revised: 12/04/2014 Document Reviewed: 04/10/2011 Elsevier Interactive Patient Education [employer].

## 2016-04-10 NOTE — Progress Notes (Signed)
Subjective:  Patient ID: Jonathan Ingram, male    DOB: 03/23/1956  Age: 60 y.o. MRN: [id number]  CC: Establish care  HPI Jonathan Ingram is a 60 y.o. male presents to the clinic today to establish care.   Preventative Healthcare  Colonoscopy: Not interested. Okay with Cologuard.  Immunizations  Tetanus - In need of.  Pneumococcal - In need of.   Flu - Not indicated at this time.  Zoster - In need of.  Prostate cancer screening: Has had recent PSA.  Labs: Has had recent labs.  Exercise: Reports regular exercise.   Alcohol use: See below.   Smoking/tobacco use: Current smoker.  Wears seat belt: Yes.   Anxiety  Stable.  In need of refill on Xanax today.  Hyperlipidemia  Stable on Zocor. Recent labs reviewed.  Low T  Currently on Clomid. Unsure why.  He states that he was started on this by a physician assistant.  PMH, Surgical Hx, Family Hx, Social History reviewed and updated as below.  Past Medical History  Diagnosis Date  . Allergy   . Anxiety   . Thrombocytopenia ([place])   . Chicken pox   . Depression   . Hyperlipidemia    Past Surgical History  Procedure Laterality Date  . No past surgeries     Family History  Problem Relation Age of Onset  . Hypertension Mother   . Bladder Cancer Mother   . Clotting disorder Mother   . Clotting disorder Brother   . Hypertension Maternal Uncle   . Bladder Cancer Maternal Uncle   . Leukemia Paternal Uncle   . Liver cancer Maternal Grandmother    Social History  Substance Use Topics  . Smoking status: Current Some Day Smoker -- 0.50 packs/day for 42 years    Types: Cigarettes  . Smokeless tobacco: Never Used  . Alcohol Use: 0.6 oz/week    1 Standard drinks or equivalent per week     Comment: 4-6 beers on the weekends   Review of Systems  Musculoskeletal: Positive for arthralgias.  Psychiatric/Behavioral:       Sadness, anxiety, stress.  All other systems reviewed and are negative.  Objective:     Today's Vitals: BP 116/84 mmHg  Pulse 63  Temp(Src) 97.8 F (36.6 C) (Oral)  Ht 5' 9.25" (1.759 m)  Wt 187 lb 8 oz (85.049 kg)  BMI 27.49 kg/m2  SpO2 97%  Physical Exam  Constitutional: He is oriented to person, place, and time. He appears well-developed and well-nourished. No distress.  HENT:  Head: Normocephalic and atraumatic.  Mouth/Throat: Oropharynx is clear and moist. No oropharyngeal exudate.  Normal TM's bilaterally.  Poor dentition.  Eyes: Conjunctivae are normal. No scleral icterus.  Neck: Neck supple. No thyromegaly present.  Cardiovascular: Normal rate and regular rhythm.   No murmur heard. Could not appreciate Left DP pulse. Remainder of pulses 2+.  Pulmonary/Chest: Effort normal.  Diffuse wheezing noted.  Abdominal: Soft. He exhibits no distension. There is no tenderness. There is no rebound and no guarding.  Musculoskeletal: Normal range of motion. He exhibits no edema.  Lymphadenopathy:    He has no cervical adenopathy.  Neurological: He is alert and oriented to person, place, and time.  Skin: Skin is warm and dry. No rash noted.  Psychiatric: He has a normal mood and affect.  Vitals reviewed.  Assessment & Plan:   Problem List Items Addressed This Visit    Thrombocytopenia ([place])    Stable. Followed by Heme/Onc.  Low testosterone    Patient started on Clomid by a physician assistant. This is out of the spectrum of primary care. Offered Urology referral.       Hyperlipidemia    Stable. Recent labs reviewed. Will discuss high intensity statin at next visit.      Encounter for general adult medical examination with abnormal findings - Primary    Tdap and Pneumovax given today. Patient to consider Cologuard. Has had recent labs and PSA. Will discuss further screening at next visit as this is my first visit with patient.      Anxiety    Stable.  Xanax refilled.       Relevant Medications   ALPRAZolam (XANAX) 0.5 MG tablet    Other  Visit Diagnoses    Need for pneumococcal vaccination        Relevant Orders    Pneumococcal polysaccharide vaccine 23-valent greater than or equal to 2yo subcutaneous/IM (Completed)    Need for prophylactic vaccination with combined diphtheria-tetanus-pertussis (DTP) vaccine        Relevant Orders    Tdap vaccine greater than or equal to 7yo IM (Completed)      Outpatient Encounter Prescriptions as of 04/10/2016  Medication Sig  . ALPRAZolam (XANAX) 0.5 MG tablet Take 1 tablet (0.5 mg total) by mouth at bedtime as needed for anxiety.  . clomiPHENE (CLOMID) 50 MG tablet 1 pill every other day  . meloxicam (MOBIC) 15 MG tablet Take 1 tablet (15 mg total) by mouth daily.  . simvastatin (ZOCOR) 20 MG tablet take 1 tablet by mouth once daily  . traZODone (DESYREL) 150 MG tablet Take 1 tablet (150 mg total) by mouth at bedtime as needed for sleep.  . [DISCONTINUED] ALPRAZolam (XANAX) 0.5 MG tablet Take 0.5 mg by mouth at bedtime as needed for anxiety. Reported on 03/31/2016   No facility-administered encounter medications on file as of 04/10/2016.    Follow-up: 6 months.  [place]

## 2016-04-11 DIAGNOSIS — Z0001 Encounter for general adult medical examination with abnormal findings: Secondary | ICD-10-CM | POA: Insufficient documentation

## 2016-04-11 DIAGNOSIS — Z0000 Encounter for general adult medical examination without abnormal findings: Secondary | ICD-10-CM | POA: Insufficient documentation

## 2016-04-11 NOTE — Assessment & Plan Note (Signed)
Patient started on Clomid by a physician assistant. This is out of the spectrum of primary care. Offered Urology referral.

## 2016-04-11 NOTE — Assessment & Plan Note (Signed)
Stable. Followed by Heme/Onc.

## 2016-04-11 NOTE — Assessment & Plan Note (Signed)
Tdap and Pneumovax given today. Patient to consider Cologuard. Has had recent labs and PSA. Will discuss further screening at next visit as this is my first visit with patient.

## 2016-04-11 NOTE — Assessment & Plan Note (Signed)
Stable. Xanax refilled.  

## 2016-04-11 NOTE — Assessment & Plan Note (Signed)
Stable. Recent labs reviewed. Will discuss high intensity statin at next visit.

## 2016-04-26 ENCOUNTER — Other Ambulatory Visit: Payer: Self-pay

## 2016-04-26 ENCOUNTER — Ambulatory Visit: Payer: Self-pay | Admitting: Internal Medicine

## 2016-06-06 ENCOUNTER — Other Ambulatory Visit: Payer: Self-pay | Admitting: Family Medicine

## 2016-06-06 NOTE — Telephone Encounter (Signed)
Refilled 04/10/16 with one refill and seen the same time.

## 2016-06-07 ENCOUNTER — Other Ambulatory Visit: Payer: Self-pay | Admitting: Family Medicine

## 2016-06-07 NOTE — Telephone Encounter (Signed)
faxed

## 2016-07-25 ENCOUNTER — Other Ambulatory Visit: Payer: Self-pay | Admitting: Physician Assistant

## 2016-07-25 DIAGNOSIS — G4700 Insomnia, unspecified: Secondary | ICD-10-CM

## 2016-07-25 DIAGNOSIS — M1990 Unspecified osteoarthritis, unspecified site: Secondary | ICD-10-CM

## 2016-07-25 NOTE — Telephone Encounter (Signed)
Med refill approved 

## 2016-07-28 ENCOUNTER — Other Ambulatory Visit: Payer: Self-pay | Admitting: Physician Assistant

## 2016-07-28 DIAGNOSIS — G4700 Insomnia, unspecified: Secondary | ICD-10-CM

## 2016-08-04 ENCOUNTER — Other Ambulatory Visit: Payer: Self-pay

## 2016-08-04 DIAGNOSIS — R7989 Other specified abnormal findings of blood chemistry: Secondary | ICD-10-CM

## 2016-08-04 NOTE — Progress Notes (Signed)
Patient came in to have blood drawn for testing per Susan's authorization. 

## 2016-08-05 LAB — CBC WITH DIFFERENTIAL/PLATELET
BASOS: 0 %
Basophils Absolute: 0 10*3/uL (ref 0.0–0.2)
EOS (ABSOLUTE): 0.2 10*3/uL (ref 0.0–0.4)
EOS: 3 %
HEMATOCRIT: 49.7 % (ref 37.5–51.0)
Hemoglobin: 16.8 g/dL (ref 12.6–17.7)
IMMATURE GRANS (ABS): 0 10*3/uL (ref 0.0–0.1)
IMMATURE GRANULOCYTES: 0 %
LYMPHS: 29 %
Lymphocytes Absolute: 1.5 10*3/uL (ref 0.7–3.1)
MCH: 33.5 pg — ABNORMAL HIGH (ref 26.6–33.0)
MCHC: 33.8 g/dL (ref 31.5–35.7)
MCV: 99 fL — ABNORMAL HIGH (ref 79–97)
MONOS ABS: 0.4 10*3/uL (ref 0.1–0.9)
Monocytes: 8 %
NEUTROS PCT: 60 %
Neutrophils Absolute: 3.1 10*3/uL (ref 1.4–7.0)
PLATELETS: 149 10*3/uL — AB (ref 150–379)
RBC: 5.02 x10E6/uL (ref 4.14–5.80)
RDW: 13.8 % (ref 12.3–15.4)
WBC: 5.2 10*3/uL (ref 3.4–10.8)

## 2016-08-05 LAB — PSA: PROSTATE SPECIFIC AG, SERUM: 1.1 ng/mL (ref 0.0–4.0)

## 2016-08-05 LAB — TESTOSTERONE: Testosterone: 272 ng/dL (ref 264–916)

## 2016-08-24 ENCOUNTER — Other Ambulatory Visit: Payer: Self-pay | Admitting: Family Medicine

## 2016-08-24 NOTE — Telephone Encounter (Signed)
Refill given for one month. Needs follow-up scheduled.

## 2016-08-24 NOTE — Telephone Encounter (Signed)
Refilled on 06/06/16.pt last seen 04/10/16. Please advise?

## 2016-08-24 NOTE — Telephone Encounter (Signed)
Faxed

## 2016-10-23 ENCOUNTER — Other Ambulatory Visit: Payer: Self-pay | Admitting: Family Medicine

## 2016-10-23 ENCOUNTER — Other Ambulatory Visit: Payer: Self-pay | Admitting: Physician Assistant

## 2016-10-23 NOTE — Telephone Encounter (Signed)
Please advise on refill.

## 2016-10-24 NOTE — Telephone Encounter (Signed)
Xanax rx faxed.

## 2016-11-06 ENCOUNTER — Ambulatory Visit: Payer: Self-pay | Admitting: Physician Assistant

## 2016-11-06 ENCOUNTER — Encounter: Payer: Self-pay | Admitting: Physician Assistant

## 2016-11-06 VITALS — BP 123/72 | HR 72 | Temp 97.8°F

## 2016-11-06 DIAGNOSIS — J069 Acute upper respiratory infection, unspecified: Secondary | ICD-10-CM

## 2016-11-06 MED ORDER — AZITHROMYCIN 250 MG PO TABS: ORAL_TABLET | ORAL | 0 refills | Status: DC

## 2016-11-06 MED ORDER — HYDROCOD POLST-CPM POLST ER 10-8 MG/5ML PO SUER: 5.0000 mL | Freq: Two times a day (BID) | ORAL | 0 refills | Status: DC | PRN

## 2016-11-06 NOTE — Addendum Note (Signed)
Addended by: Rudene Anda T on: 11/06/2016 12:08 PM   Modules accepted: Orders

## 2016-11-06 NOTE — Progress Notes (Signed)
S: C/o runny nose and congestion for 5 days, no fever, chills, cp/sob, v/d; mucus is green and thick, cough is dry and harsh, worse at night,  c/o of facial and dental pain.   Using otc meds:   O: PE: vitals wnl, nad, perrl eomi, normocephalic, tms dull, nasal mucosa red and swollen, throat injected, neck supple no lymph, lungs c t a, cv rrr, neuro intact  A:  Acute sinusitis, uri   P: drink fluids, continue regular meds , use otc meds of choice, return if not improving in 5 days, return earlier if worsening , zpack, tussionex

## 2016-11-08 LAB — CMP12+LP+TP+TSH+6AC+PSA+CBC…
ALBUMIN: 4.7 g/dL (ref 3.6–4.8)
ALT: 16 IU/L (ref 0–44)
AST: 13 IU/L (ref 0–40)
Albumin/Globulin Ratio: 2.2 (ref 1.2–2.2)
Alkaline Phosphatase: 76 IU/L (ref 39–117)
BUN / CREAT RATIO: 13 (ref 10–24)
BUN: 14 mg/dL (ref 8–27)
Bilirubin Total: 0.5 mg/dL (ref 0.0–1.2)
CALCIUM: 9.1 mg/dL (ref 8.6–10.2)
CHLORIDE: 101 mmol/L (ref 96–106)
CHOLESTEROL TOTAL: 175 mg/dL (ref 100–199)
CREATININE: 1.04 mg/dL (ref 0.76–1.27)
Chol/HDL Ratio: 4.4 ratio units (ref 0.0–5.0)
ESTIMATED CHD RISK: 0.9 times avg. (ref 0.0–1.0)
FREE THYROXINE INDEX: 1.2 (ref 1.2–4.9)
GFR, EST AFRICAN AMERICAN: 89 mL/min/{1.73_m2} (ref >59)
GFR, EST NON AFRICAN AMERICAN: 77 mL/min/{1.73_m2} (ref >59)
GGT: 22 IU/L (ref 0–65)
Globulin, Total: 2.1 g/dL (ref 1.5–4.5)
Glucose: 106 mg/dL — ABNORMAL HIGH (ref 65–99)
HDL: 40 mg/dL (ref >39)
IRON: 138 ug/dL (ref 38–169)
LDH: 174 IU/L (ref 121–224)
LDL CALC: 93 mg/dL (ref 0–99)
Phosphorus: 3.8 mg/dL (ref 2.5–4.5)
Potassium: 4.9 mmol/L (ref 3.5–5.2)
Prostate Specific Ag, Serum: 1.4 ng/mL (ref 0.0–4.0)
SODIUM: 143 mmol/L (ref 134–144)
T3 UPTAKE RATIO: 27 % (ref 24–39)
T4, Total: 4.6 ug/dL (ref 4.5–12.0)
TOTAL PROTEIN: 6.8 g/dL (ref 6.0–8.5)
TSH: 4.24 u[IU]/mL (ref 0.450–4.500)
Triglycerides: 212 mg/dL — ABNORMAL HIGH (ref 0–149)
Uric Acid: 5.1 mg/dL (ref 3.7–8.6)
VLDL Cholesterol Cal: 42 mg/dL — ABNORMAL HIGH (ref 5–40)

## 2016-11-08 LAB — TESTOSTERONE: Testosterone: 259 ng/dL — ABNORMAL LOW (ref 264–916)

## 2016-11-22 ENCOUNTER — Other Ambulatory Visit: Payer: Self-pay | Admitting: Physician Assistant

## 2016-11-22 ENCOUNTER — Other Ambulatory Visit: Payer: Self-pay | Admitting: Family Medicine

## 2016-11-22 DIAGNOSIS — M1990 Unspecified osteoarthritis, unspecified site: Secondary | ICD-10-CM

## 2016-11-22 NOTE — Telephone Encounter (Signed)
Refilled 10/23/16. Pt last seen 04/10/16. Please advise?

## 2016-11-22 NOTE — Telephone Encounter (Signed)
Med refill for mobic approved, kidney function on 11/06/16 were normal

## 2016-11-22 NOTE — Telephone Encounter (Signed)
faxed

## 2017-01-23 IMAGING — US US ABDOMEN COMPLETE
1 series · 14 of 25 positions shown · non-contrast
Comparison: None.

CLINICAL DATA: Thrombocytopenia and splenomegaly on physical exam

EXAM:
ABDOMEN ULTRASOUND COMPLETE

[Series 1: us abdomen complete · 0.28mm/px · 14 of 95 slices shown]
[im 1/95]
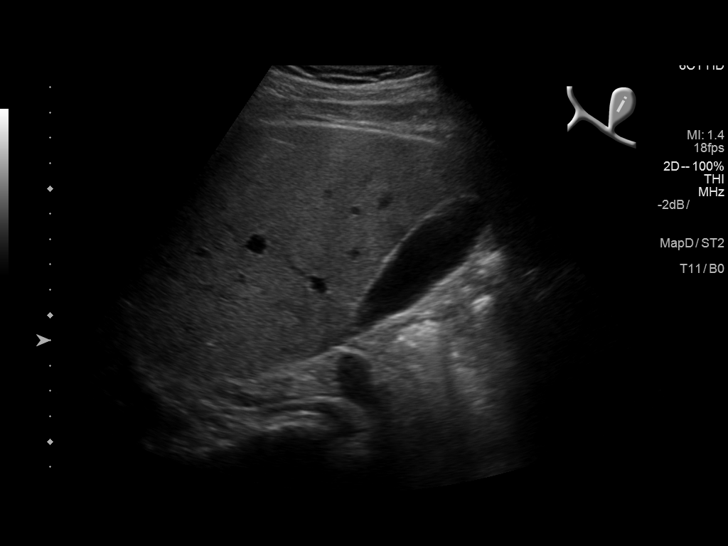
[im 8/95]
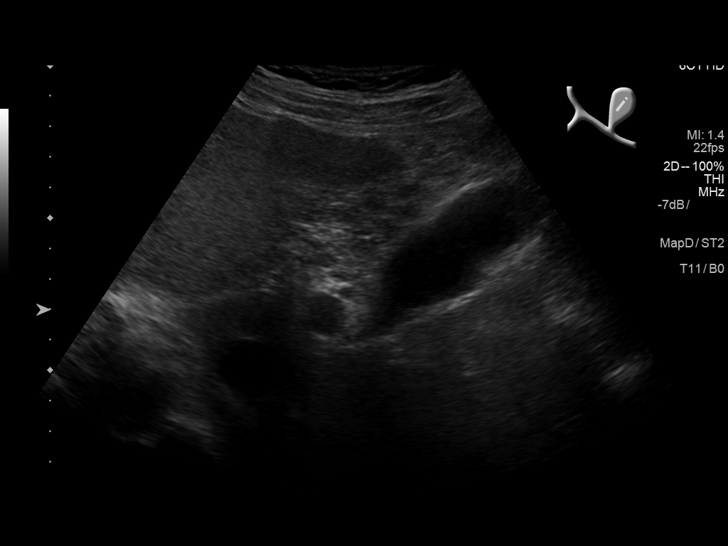
[im 16/95]
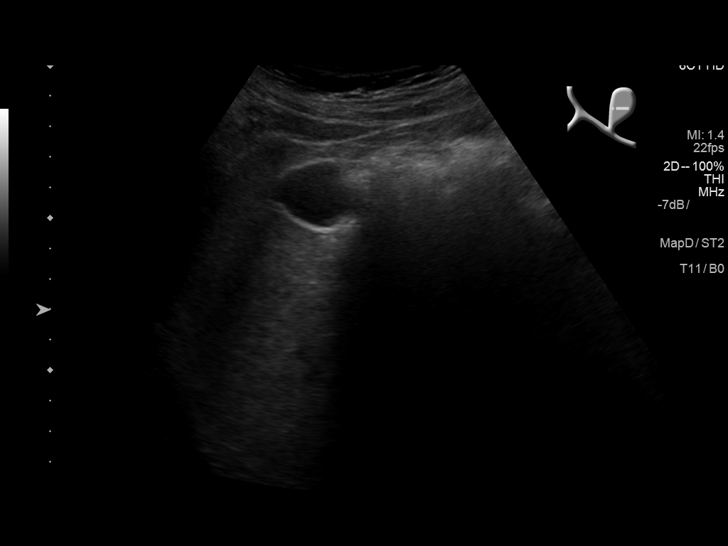
[im 24/95]
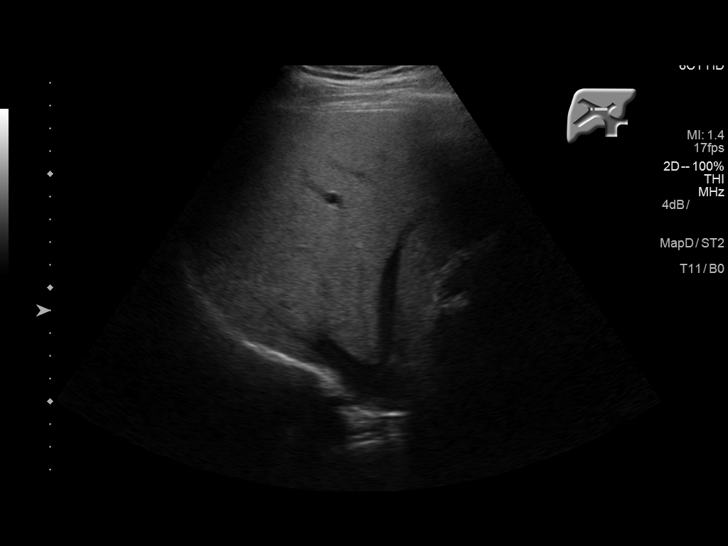
[im 32/95]
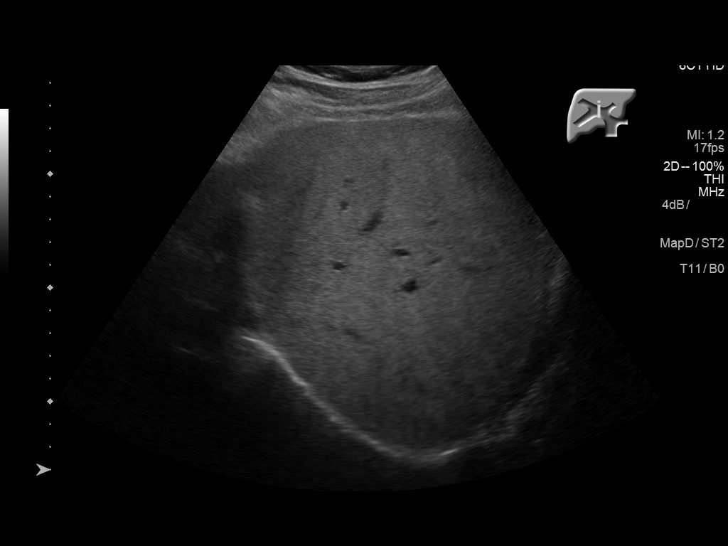
[im 36/95]
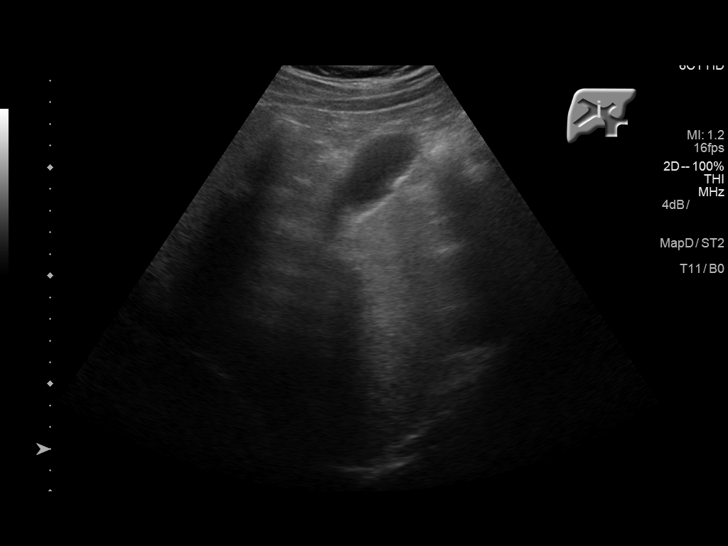
[im 44/95]
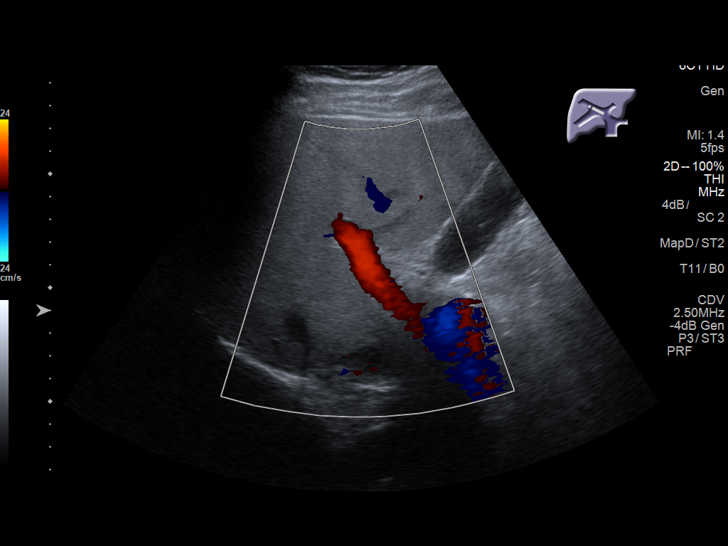
[im 51/95]
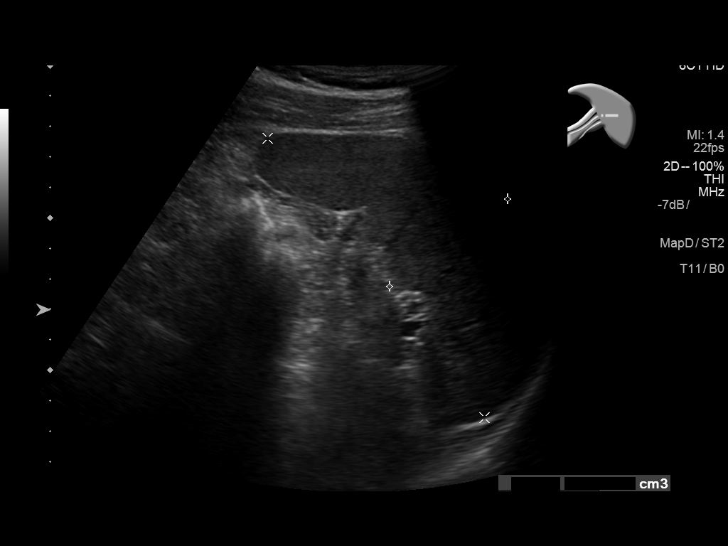
[im 59/95]
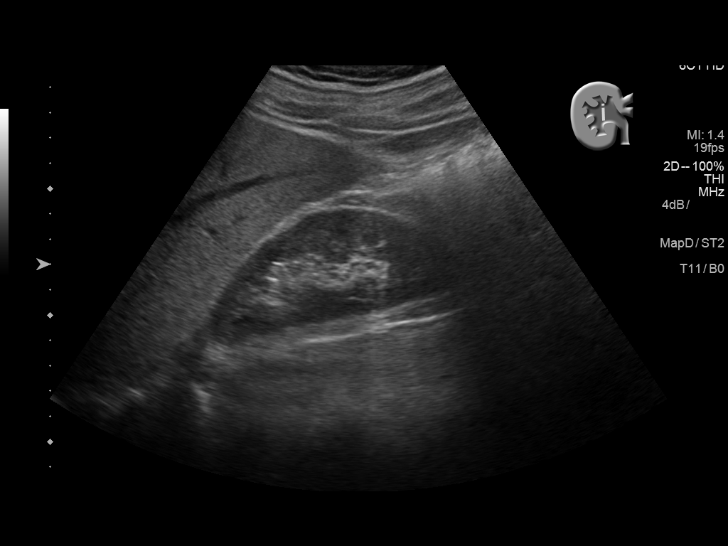
[im 63/95]
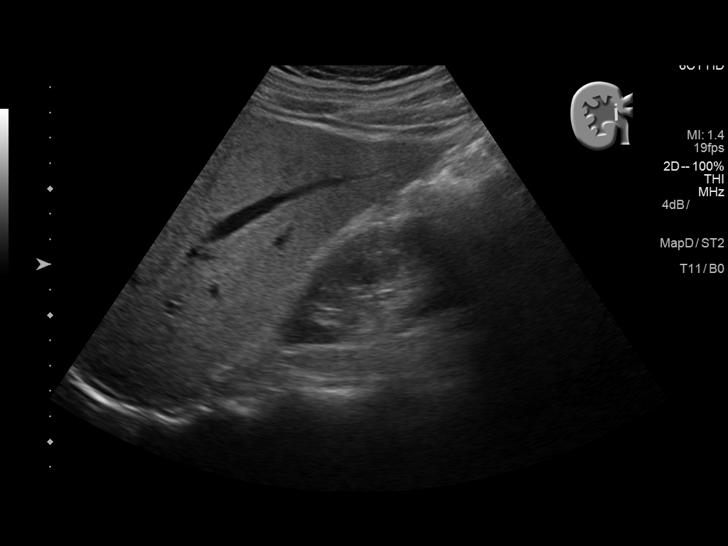
[im 71/95]
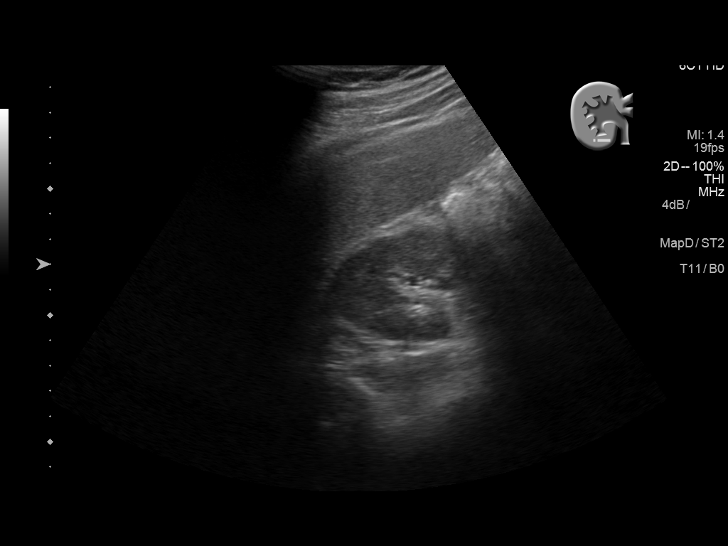
[im 79/95]
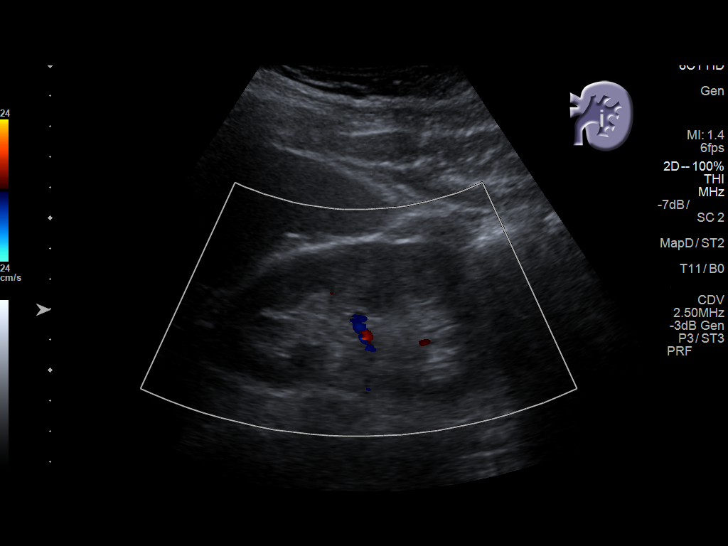
[im 87/95]
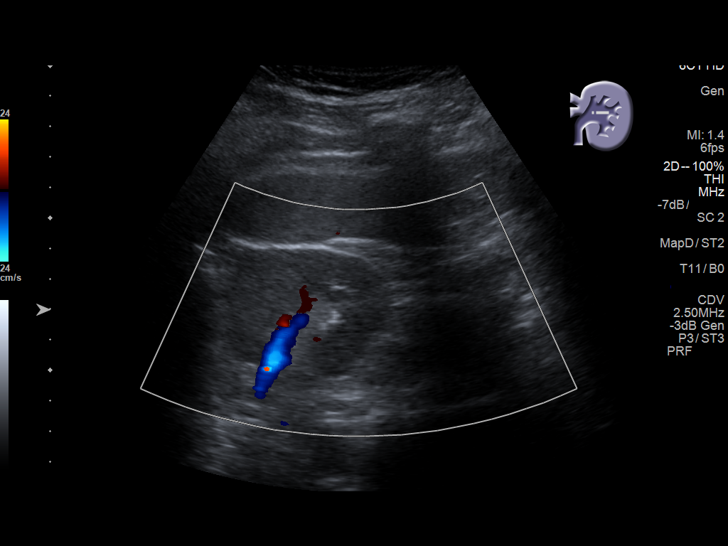
[im 95/95]
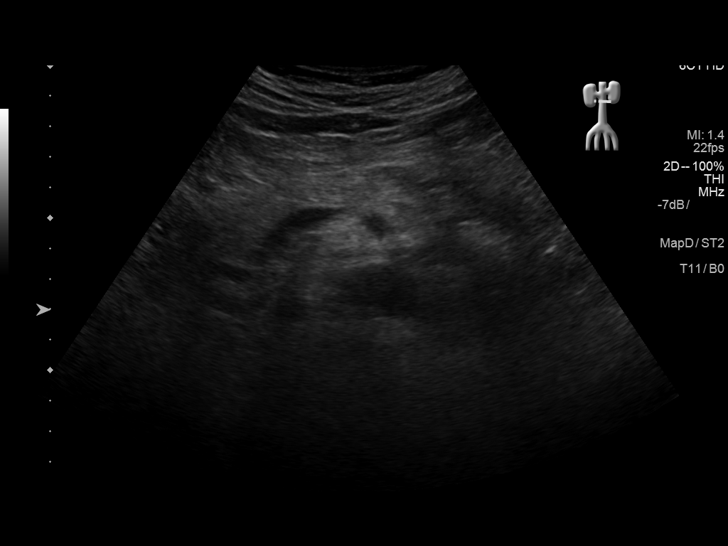

[14 of 25 positions shown; findings below may reference images not displayed]

FINDINGS: Gallbladder: No gallstones or wall thickening visualized. No
sonographic Murphy sign noted by sonographer.

Common bile duct: Diameter: 4.7 mm.

Liver: Increased echogenicity consistent with fatty infiltration. No
focal mass lesion is noted.

IVC: No abnormality visualized.

Pancreas: Somewhat obscured by overlying bowel gas.

Spleen: Size and appearance within normal limits.

Right Kidney: Length: 10.6 cm.. Echogenicity within normal limits.
No mass or hydronephrosis visualized.

Left Kidney: Length: 10.3 cm.. Echogenicity within normal limits. No
mass or hydronephrosis visualized.

Abdominal aorta: No aneurysm visualized.

Other findings: None.
IMPRESSION: Fatty liver, no acute abnormality noted.

## 2017-01-30 ENCOUNTER — Other Ambulatory Visit: Payer: Self-pay | Admitting: Family Medicine

## 2017-01-31 NOTE — Telephone Encounter (Signed)
faxed

## 2017-01-31 NOTE — Telephone Encounter (Signed)
Last refilled : 11/22/16  Last OV: 04/10/16 Future OV: none   Please advise?

## 2017-02-27 ENCOUNTER — Other Ambulatory Visit: Payer: Self-pay | Admitting: Physician Assistant

## 2017-02-27 DIAGNOSIS — G4700 Insomnia, unspecified: Secondary | ICD-10-CM

## 2017-02-27 DIAGNOSIS — M1990 Unspecified osteoarthritis, unspecified site: Secondary | ICD-10-CM

## 2017-02-28 NOTE — Telephone Encounter (Signed)
Med refill for mobic approved 

## 2017-03-22 ENCOUNTER — Other Ambulatory Visit: Payer: Self-pay | Admitting: Physician Assistant

## 2017-03-22 DIAGNOSIS — G4700 Insomnia, unspecified: Secondary | ICD-10-CM

## 2017-03-26 ENCOUNTER — Other Ambulatory Visit: Payer: Self-pay | Admitting: Physician Assistant

## 2017-03-27 NOTE — Telephone Encounter (Signed)
Med refill for flonase approved

## 2017-04-03 ENCOUNTER — Other Ambulatory Visit: Payer: Self-pay | Admitting: Emergency Medicine

## 2017-04-06 ENCOUNTER — Other Ambulatory Visit: Payer: Self-pay | Admitting: Physician Assistant

## 2017-04-06 DIAGNOSIS — G4700 Insomnia, unspecified: Secondary | ICD-10-CM

## 2017-04-09 NOTE — Telephone Encounter (Signed)
Med refill for trazadone approved 

## 2017-04-26 ENCOUNTER — Other Ambulatory Visit: Payer: Self-pay | Admitting: Physician Assistant

## 2017-05-21 ENCOUNTER — Other Ambulatory Visit: Payer: Self-pay | Admitting: Physician Assistant

## 2017-05-30 ENCOUNTER — Other Ambulatory Visit: Payer: Self-pay | Admitting: Family Medicine

## 2017-05-31 NOTE — Telephone Encounter (Signed)
Script faxed [employer].

## 2017-05-31 NOTE — Telephone Encounter (Signed)
Last refilled : 01/2017  Last OV: 04/10/16 Future OV: none

## 2017-06-28 ENCOUNTER — Other Ambulatory Visit: Payer: Self-pay | Admitting: Physician Assistant

## 2017-06-28 DIAGNOSIS — M1990 Unspecified osteoarthritis, unspecified site: Secondary | ICD-10-CM

## 2017-06-29 NOTE — Telephone Encounter (Signed)
Med refill for mobic approved 

## 2017-07-05 ENCOUNTER — Other Ambulatory Visit: Payer: Self-pay | Admitting: Physician Assistant

## 2017-07-05 MED ORDER — SIMVASTATIN 20 MG PO TABS: 20.0000 mg | ORAL_TABLET | Freq: Every day | ORAL | 3 refills | Status: DC

## 2017-07-05 NOTE — Progress Notes (Signed)
Med refill for simvastatin approved

## 2017-07-25 ENCOUNTER — Other Ambulatory Visit: Payer: Self-pay

## 2017-07-25 DIAGNOSIS — Z299 Encounter for prophylactic measures, unspecified: Secondary | ICD-10-CM

## 2017-07-25 NOTE — Progress Notes (Signed)
Patient came in to have blood drawn for testing per Susan's authorization. 

## 2017-07-26 LAB — CMP12+LP+TP+TSH+6AC+PSA+CBC…
A/G RATIO: 2.1 (ref 1.2–2.2)
ALT: 16 IU/L (ref 0–44)
AST: 17 IU/L (ref 0–40)
Albumin: 4.7 g/dL (ref 3.6–4.8)
Alkaline Phosphatase: 62 IU/L (ref 39–117)
BASOS ABS: 0 10*3/uL (ref 0.0–0.2)
BILIRUBIN TOTAL: 0.6 mg/dL (ref 0.0–1.2)
BUN/Creatinine Ratio: 16 (ref 10–24)
BUN: 18 mg/dL (ref 8–27)
Basos: 0 %
CHOL/HDL RATIO: 4 ratio (ref 0.0–5.0)
CREATININE: 1.14 mg/dL (ref 0.76–1.27)
Calcium: 9.5 mg/dL (ref 8.6–10.2)
Chloride: 103 mmol/L (ref 96–106)
Cholesterol, Total: 178 mg/dL (ref 100–199)
EOS (ABSOLUTE): 0.1 10*3/uL (ref 0.0–0.4)
Eos: 2 %
Estimated CHD Risk: 0.8 times avg. (ref 0.0–1.0)
Free Thyroxine Index: 1.3 (ref 1.2–4.9)
GFR calc Af Amer: 79 mL/min/{1.73_m2} (ref >59)
GFR calc non Af Amer: 69 mL/min/{1.73_m2} (ref >59)
GGT: 20 IU/L (ref 0–65)
GLUCOSE: 104 mg/dL — AB (ref 65–99)
Globulin, Total: 2.2 g/dL (ref 1.5–4.5)
HDL: 44 mg/dL (ref >39)
Hematocrit: 49 % (ref 37.5–51.0)
Hemoglobin: 16.8 g/dL (ref 13.0–17.7)
IMMATURE GRANULOCYTES: 0 %
IRON: 109 ug/dL (ref 38–169)
Immature Grans (Abs): 0 10*3/uL (ref 0.0–0.1)
LDH: 162 IU/L (ref 121–224)
LDL Calculated: 90 mg/dL (ref 0–99)
LYMPHS ABS: 1.3 10*3/uL (ref 0.7–3.1)
LYMPHS: 26 %
MCH: 34.1 pg — ABNORMAL HIGH (ref 26.6–33.0)
MCHC: 34.3 g/dL (ref 31.5–35.7)
MCV: 99 fL — ABNORMAL HIGH (ref 79–97)
Monocytes Absolute: 0.3 10*3/uL (ref 0.1–0.9)
Monocytes: 6 %
NEUTROS PCT: 66 %
Neutrophils Absolute: 3.3 10*3/uL (ref 1.4–7.0)
Phosphorus: 4.4 mg/dL (ref 2.5–4.5)
Platelets: 135 10*3/uL — ABNORMAL LOW (ref 150–379)
Potassium: 5.2 mmol/L (ref 3.5–5.2)
Prostate Specific Ag, Serum: 1.1 ng/mL (ref 0.0–4.0)
RBC: 4.93 x10E6/uL (ref 4.14–5.80)
RDW: 13.9 % (ref 12.3–15.4)
Sodium: 145 mmol/L — ABNORMAL HIGH (ref 134–144)
T3 UPTAKE RATIO: 26 % (ref 24–39)
T4, Total: 4.9 ug/dL (ref 4.5–12.0)
TSH: 4.97 u[IU]/mL — ABNORMAL HIGH (ref 0.450–4.500)
Total Protein: 6.9 g/dL (ref 6.0–8.5)
Triglycerides: 220 mg/dL — ABNORMAL HIGH (ref 0–149)
URIC ACID: 4.8 mg/dL (ref 3.7–8.6)
VLDL CHOLESTEROL CAL: 44 mg/dL — AB (ref 5–40)
WBC: 5.1 10*3/uL (ref 3.4–10.8)

## 2017-07-26 LAB — TESTOSTERONE: Testosterone: 271 ng/dL (ref 264–916)

## 2017-08-01 ENCOUNTER — Other Ambulatory Visit: Payer: Self-pay | Admitting: Physician Assistant

## 2017-08-01 DIAGNOSIS — G4700 Insomnia, unspecified: Secondary | ICD-10-CM

## 2017-08-02 NOTE — Telephone Encounter (Signed)
Med refill for trazadone approved

## 2017-08-07 LAB — SPECIMEN STATUS REPORT

## 2017-08-07 LAB — HGB A1C W/O EAG: Hgb A1c MFr Bld: 5.3 % (ref 4.8–5.6)

## 2017-10-01 ENCOUNTER — Other Ambulatory Visit: Payer: Self-pay | Admitting: Family Medicine

## 2017-10-02 NOTE — Telephone Encounter (Signed)
Patient has not seen provider since 2017 but last fill was 05/31/2017 for 90

## 2017-10-02 NOTE — Telephone Encounter (Signed)
Please find out how often he takes this medication and then I will consider refilling.  He will need to have a visit to establish care to get the refill as well.

## 2017-10-03 NOTE — Telephone Encounter (Signed)
Patient has scheduled new patient appointment he takes for anxiety and usually one tablet nightly. Appointment is 1/19

## 2017-10-04 NOTE — Telephone Encounter (Signed)
Script faxed to [facility]

## 2017-12-17 ENCOUNTER — Ambulatory Visit: Payer: Self-pay | Admitting: Family Medicine

## 2018-01-30 ENCOUNTER — Other Ambulatory Visit: Payer: Self-pay

## 2018-01-30 ENCOUNTER — Encounter: Payer: Self-pay | Admitting: Family Medicine

## 2018-01-30 ENCOUNTER — Ambulatory Visit: Payer: Managed Care, Other (non HMO) | Admitting: Family Medicine

## 2018-01-30 VITALS — BP 126/92 | HR 72 | Temp 97.4°F | Wt 189.4 lb

## 2018-01-30 DIAGNOSIS — R7989 Other specified abnormal findings of blood chemistry: Secondary | ICD-10-CM

## 2018-01-30 DIAGNOSIS — M19041 Primary osteoarthritis, right hand: Secondary | ICD-10-CM | POA: Diagnosis not present

## 2018-01-30 DIAGNOSIS — E785 Hyperlipidemia, unspecified: Secondary | ICD-10-CM

## 2018-01-30 DIAGNOSIS — F419 Anxiety disorder, unspecified: Secondary | ICD-10-CM | POA: Diagnosis not present

## 2018-01-30 DIAGNOSIS — D696 Thrombocytopenia, unspecified: Secondary | ICD-10-CM

## 2018-01-30 DIAGNOSIS — R030 Elevated blood-pressure reading, without diagnosis of hypertension: Secondary | ICD-10-CM | POA: Diagnosis not present

## 2018-01-30 DIAGNOSIS — Z23 Encounter for immunization: Secondary | ICD-10-CM

## 2018-01-30 DIAGNOSIS — M1990 Unspecified osteoarthritis, unspecified site: Secondary | ICD-10-CM | POA: Insufficient documentation

## 2018-01-30 DIAGNOSIS — Z1211 Encounter for screening for malignant neoplasm of colon: Secondary | ICD-10-CM

## 2018-01-30 DIAGNOSIS — M19042 Primary osteoarthritis, left hand: Secondary | ICD-10-CM | POA: Diagnosis not present

## 2018-01-30 DIAGNOSIS — G4700 Insomnia, unspecified: Secondary | ICD-10-CM

## 2018-01-30 HISTORY — DX: Other specified abnormal findings of blood chemistry: R79.89

## 2018-01-30 MED ORDER — ALPRAZOLAM 0.5 MG PO TABS: ORAL_TABLET | ORAL | 1 refills | Status: DC

## 2018-01-30 MED ORDER — TRAZODONE HCL 150 MG PO TABS: ORAL_TABLET | ORAL | 3 refills | Status: DC

## 2018-01-30 NOTE — Progress Notes (Signed)
Jonathan Rumps, MD Phone: [phone]  EDRIK RUNDLE is a 62 y.o. male who presents today for f/u.  Anxiety: Notes this is generalized.  No depression.  Takes Xanax and trazodone at night to get his mind to stop running.  No drowsiness the next day.  Hyperlipidemia: Taking simvastatin.  No right upper quadrant pain or myalgias.  Elevated blood pressure: No history of hypertension.  No chest pain or shortness of breath.  Thrombocytopenia: Chronic history of this.  Minimally low.  No bleeding issues.  Followed with hematology for some time and reports negative workup.  Has not seen them in 2 years.  Nodes aches and pains in his bilateral hands particularly in his thumbs at the MCP joints.  Notes some nodules in those areas.  Takes meloxicam at night and this is somewhat helpful.  No family history of inflammatory arthritis.  No other joint aches.  Social History   Tobacco Use  Smoking Status Current Some Day Smoker  . Packs/day: 0.50  . Years: 42.00  . Pack years: 21.00  . Types: Cigarettes  Smokeless Tobacco Never Used     ROS see history of present illness  Objective  Physical Exam Vitals:   01/30/18 0855  BP: (!) 126/92  Pulse: 72  Temp: (!) 97.4 F (36.3 C)  SpO2: 98%    BP Readings from Last 3 Encounters:  01/30/18 (!) 126/92  11/06/16 123/72  04/10/16 116/84   Wt Readings from Last 3 Encounters:  01/30/18 189 lb 6.4 oz (85.9 kg)  04/10/16 187 lb 8 oz (85 kg)  01/26/16 190 lb 4.1 oz (86.3 kg)    Physical Exam  Constitutional: No distress.  Cardiovascular: Normal rate, regular rhythm and normal heart sounds.  Pulmonary/Chest: Effort normal and breath sounds normal.  Musculoskeletal: He exhibits no edema.  Bilateral thumb MCP joints with slight nodularity, no warmth or erythema or significant tenderness, full range of motion, no joint tenderness in the rest of his hands or fingers, 5/5 strength bilateral hands, sensation to light touch intact bilateral  hands, 2+ radial pulses bilaterally  Neurological: He is alert. Gait normal.  Skin: Skin is warm and dry. He is not diaphoretic.     Assessment/Plan: Please see individual problem list.  Thrombocytopenia ([place]) Has been stable.  He has no bleeding issues.  He has not been following with hematology.  We will continue to periodically monitor CBC.  If he develops symptoms or his platelets drop he will let us know we can get him back to see hematology.  Insomnia Doing well on Xanax and trazodone.  He will monitor for drowsiness.  Anxiety Stable on current regimen.  Refills provided.  Osteoarthritis Suspect osteoarthritis in his bilateral hands.  Discussed transitioning meloxicam to the morning to see if that is more beneficial.  He will continue to monitor.  Elevated blood pressure reading No history of hypertension.  He will check at home daily for the next week and return for nurse visit for BP check.  Elevated TSH Noted on lab work from last year.  Written prescription given for this and a BMP to be done at employee health.  Hyperlipidemia Well-controlled LDL.  Tolerating simvastatin.   Orders Placed This Encounter  Procedures  . Flu Vaccine QUAD 36+ mos IM  . Cologuard    Meds ordered this encounter  Medications  . ALPRAZolam (XANAX) 0.5 MG tablet    Sig: take 1 tablet by mouth at bedtime if needed for anxiety    Dispense:  90 tablet    Refill:  1  . traZODone (DESYREL) 150 MG tablet    Sig: take 1 tablet by mouth at bedtime if needed for sleep    Dispense:  90 tablet    Refill:  [place], MD [place]

## 2018-01-30 NOTE — Patient Instructions (Signed)
Nice to meet you. Please have your TSH checked. Please check your blood pressure daily for the next week and then we will have you return for nurse check. Please try taking the meloxicam in the morning to see if that helps with your hand pain.

## 2018-01-30 NOTE — Assessment & Plan Note (Signed)
No history of hypertension.  He will check at home daily for the next week and return for nurse visit for BP check.

## 2018-01-30 NOTE — Assessment & Plan Note (Signed)
Noted on lab work from last year.  Written prescription given for this and a BMP to be done at employee health.

## 2018-01-30 NOTE — Assessment & Plan Note (Signed)
Doing well on Xanax and trazodone.  He will monitor for drowsiness.

## 2018-01-30 NOTE — Assessment & Plan Note (Addendum)
Has been stable.  He has no bleeding issues.  He has not been following with hematology.  We will continue to periodically monitor CBC.  If he develops symptoms or his platelets drop he will let us know we can get him back to see hematology.

## 2018-01-30 NOTE — Assessment & Plan Note (Signed)
Suspect osteoarthritis in his bilateral hands.  Discussed transitioning meloxicam to the morning to see if that is more beneficial.  He will continue to monitor.

## 2018-01-30 NOTE — Assessment & Plan Note (Signed)
Well-controlled LDL.  Tolerating simvastatin.

## 2018-01-30 NOTE — Assessment & Plan Note (Signed)
Stable on current regimen.  Refills provided.

## 2018-02-07 ENCOUNTER — Ambulatory Visit (INDEPENDENT_AMBULATORY_CARE_PROVIDER_SITE_OTHER): Payer: Managed Care, Other (non HMO) | Admitting: *Deleted

## 2018-02-07 ENCOUNTER — Encounter: Payer: Self-pay | Admitting: *Deleted

## 2018-02-07 VITALS — BP 134/80 | HR 72

## 2018-02-07 DIAGNOSIS — R030 Elevated blood-pressure reading, without diagnosis of hypertension: Secondary | ICD-10-CM

## 2018-02-07 NOTE — Progress Notes (Signed)
Patient presented for one week BP follow up , patient stated when checking at home BP has been running in 130/78, patient was agitated when he arrived due to having to wait and that he has court in the afternoon.  Patient first BP check left arm 162/84 pulse 72, patient was allowed to rest an additional 15 minutes and BP taken in left arm 134/80 pulse 72.

## 2018-02-07 NOTE — Progress Notes (Signed)
Patient's blood pressure is acceptable at home.  Improved on recheck here.  He should continue to periodically monitor at home and if it goes up let us know.

## 2018-02-11 NOTE — Progress Notes (Signed)
Patient DPR  notified and voiced understanding. 

## 2018-02-12 ENCOUNTER — Other Ambulatory Visit: Payer: Self-pay

## 2018-02-12 DIAGNOSIS — R7989 Other specified abnormal findings of blood chemistry: Secondary | ICD-10-CM

## 2018-02-12 NOTE — Addendum Note (Signed)
Addended by: Carlene Coria on: 02/12/2018 08:23 AM   Modules accepted: Level of Service

## 2018-02-13 LAB — BASIC METABOLIC PANEL WITH GFR
BUN/Creatinine Ratio: 14 (ref 10–24)
BUN: 15 mg/dL (ref 8–27)
CO2: 26 mmol/L (ref 20–29)
Calcium: 9.3 mg/dL (ref 8.6–10.2)
Chloride: 101 mmol/L (ref 96–106)
Creatinine, Ser: 1.07 mg/dL (ref 0.76–1.27)
GFR calc Af Amer: 86 mL/min/1.73
GFR calc non Af Amer: 74 mL/min/1.73
Glucose: 106 mg/dL — ABNORMAL HIGH (ref 65–99)
Potassium: 4.8 mmol/L (ref 3.5–5.2)
Sodium: 141 mmol/L (ref 134–144)

## 2018-02-13 LAB — SPECIMEN STATUS REPORT

## 2018-02-13 LAB — TSH: TSH: 6.21 u[IU]/mL — ABNORMAL HIGH (ref 0.450–4.500)

## 2018-02-19 LAB — T3, FREE: T3 FREE: 2.7 pg/mL (ref 2.0–4.4)

## 2018-02-19 LAB — T4, FREE: Free T4: 0.91 ng/dL (ref 0.82–1.77)

## 2018-02-19 LAB — SPECIMEN STATUS REPORT

## 2018-04-01 ENCOUNTER — Other Ambulatory Visit: Payer: Self-pay | Admitting: Family Medicine

## 2018-04-01 DIAGNOSIS — M1990 Unspecified osteoarthritis, unspecified site: Secondary | ICD-10-CM

## 2018-04-02 NOTE — Telephone Encounter (Signed)
Last OV 01/30/18 last filled 06/29/17 30 3rf

## 2018-05-07 ENCOUNTER — Ambulatory Visit: Payer: Self-pay | Admitting: Family Medicine

## 2018-07-08 ENCOUNTER — Ambulatory Visit: Payer: Managed Care, Other (non HMO) | Admitting: Family Medicine

## 2018-07-08 ENCOUNTER — Other Ambulatory Visit: Payer: Self-pay | Admitting: Family Medicine

## 2018-07-08 ENCOUNTER — Encounter: Payer: Self-pay | Admitting: Family Medicine

## 2018-07-08 VITALS — BP 130/88 | HR 68 | Temp 98.7°F | Ht 69.0 in | Wt 191.4 lb

## 2018-07-08 DIAGNOSIS — D229 Melanocytic nevi, unspecified: Secondary | ICD-10-CM

## 2018-07-08 DIAGNOSIS — E785 Hyperlipidemia, unspecified: Secondary | ICD-10-CM

## 2018-07-08 DIAGNOSIS — F419 Anxiety disorder, unspecified: Secondary | ICD-10-CM

## 2018-07-08 DIAGNOSIS — D1801 Hemangioma of skin and subcutaneous tissue: Secondary | ICD-10-CM | POA: Insufficient documentation

## 2018-07-08 DIAGNOSIS — Z5181 Encounter for therapeutic drug level monitoring: Secondary | ICD-10-CM | POA: Diagnosis not present

## 2018-07-08 DIAGNOSIS — R7989 Other specified abnormal findings of blood chemistry: Secondary | ICD-10-CM

## 2018-07-08 DIAGNOSIS — M19041 Primary osteoarthritis, right hand: Secondary | ICD-10-CM

## 2018-07-08 DIAGNOSIS — D696 Thrombocytopenia, unspecified: Secondary | ICD-10-CM | POA: Diagnosis not present

## 2018-07-08 DIAGNOSIS — M19042 Primary osteoarthritis, left hand: Secondary | ICD-10-CM

## 2018-07-08 DIAGNOSIS — M1990 Unspecified osteoarthritis, unspecified site: Secondary | ICD-10-CM

## 2018-07-08 MED ORDER — FLUTICASONE PROPIONATE 50 MCG/ACT NA SUSP: NASAL | 5 refills | Status: DC

## 2018-07-08 MED ORDER — SIMVASTATIN 20 MG PO TABS: 20.0000 mg | ORAL_TABLET | Freq: Every day | ORAL | 3 refills | Status: DC

## 2018-07-08 NOTE — Assessment & Plan Note (Signed)
Check TSH and free T4 

## 2018-07-08 NOTE — Assessment & Plan Note (Signed)
Check CBC 

## 2018-07-08 NOTE — Assessment & Plan Note (Signed)
Adequately controlled.  Continue meloxicam.  Check renal function.

## 2018-07-08 NOTE — Progress Notes (Signed)
Jonathan Rumps, MD Phone: [phone]  Jonathan Ingram is a 62 y.o. male who presents today for f/u.  CC: HLD, anxiety, OA, elevated tsh, skin lesion  HYPERLIPIDEMIA Symptoms Chest pain on exertion:  no    Medications: Compliance- taking simvastatin Right upper quadrant pain- no  Muscle aches- no  Anxiety: Notes this is so-so.  He is raising his 78-year-old takes Xanax once nightly and excessively drowsy.  No alcohol use.  No depression.  He does take trazodone.  Elevated TSH: Noted previously.  Normal thyroid hormones.  No skin changes.  No fatigue.  Does note cold intolerance.  Osteoarthritis: Notes this bothers him in his hands and wrists.  Right greater than left.  Meloxicam is helpful.  He does take it with food and there is no abdominal discomfort.  He reports a skin lesion on his right side that his wife noted. He notes it has been there for a long time, though his wife noticed it recently and thought it looked odd.  He does have scattered nevi and hemangiomas.  He does have quite a bit of sun exposure.  Social History   Tobacco Use  Smoking Status Current Some Day Smoker  . Packs/day: 0.50  . Years: 42.00  . Pack years: 21.00  . Types: Cigarettes  Smokeless Tobacco Never Used     ROS see history of present illness  Objective  Physical Exam Vitals:   07/08/18 1555  BP: 130/88  Pulse: 68  Temp: 98.7 F (37.1 C)  SpO2: 97%    BP Readings from Last 3 Encounters:  07/08/18 130/88  02/07/18 134/80  01/30/18 (!) 126/92   Wt Readings from Last 3 Encounters:  07/08/18 191 lb 6.4 oz (86.8 kg)  01/30/18 189 lb 6.4 oz (85.9 kg)  04/10/16 187 lb 8 oz (85 kg)    Physical Exam  Constitutional: No distress.  Cardiovascular: Normal rate, regular rhythm and normal heart sounds.  Pulmonary/Chest: Effort normal and breath sounds normal.  Musculoskeletal: He exhibits no edema.  Neurological: He is alert.  Skin: Skin is warm and dry. He is not diaphoretic.    Scattered red appearing cherry hemangiomas, on his right flank a slightly pedunculated appearing purple lesion that is about 0.4 to 0.5 cm in diameter, it is nontender, it has regular borders, uniform color, no asymmetry     Assessment/Plan: Please see individual problem list.  Osteoarthritis Adequately controlled.  Continue meloxicam.  Check renal function.  Thrombocytopenia (HCC) Check CBC.    Anxiety Stable.  Continue current regimen.  Elevated TSH Check TSH and free T4.  Hemangioma of skin Patient with numerous scattered red hemangiomas on his torso.  His lesion of concern appears to be purple and is likely a benign hemangioma as well.  This has been present for some time and he notes it does not seem to be changed.  We will refer to dermatology given his numerous skin lesions for exam and they can evaluate this lesion further.  Hyperlipidemia Continue simvastatin.   Orders Placed This Encounter  Procedures  . Basic Metabolic Panel (BMET)  . TSH  . T4, free  . CBC  . Ambulatory referral to Dermatology    Referral Priority:   Routine    Referral Type:   Consultation    Referral Reason:   Specialty Services Required    Requested Specialty:   Dermatology    Number of Visits Requested:   1    Meds ordered this encounter  Medications  .  fluticasone (FLONASE) 50 MCG/ACT nasal spray    Sig: instill 2 sprays into each nostril once daily    Dispense:  16 g    Refill:  5  . simvastatin (ZOCOR) 20 MG tablet    Sig: Take 1 tablet (20 mg total) by mouth daily.    Dispense:  90 tablet    Refill:  [place], MD [place]

## 2018-07-08 NOTE — Assessment & Plan Note (Signed)
Stable  Continue current regimen  

## 2018-07-08 NOTE — Patient Instructions (Signed)
Nice to see you. We will check lab work today and contact you with the results. 

## 2018-07-08 NOTE — Assessment & Plan Note (Signed)
Continue simvastatin. 

## 2018-07-08 NOTE — Assessment & Plan Note (Signed)
Patient with numerous scattered red hemangiomas on his torso.  His lesion of concern appears to be purple and is likely a benign hemangioma as well.  This has been present for some time and he notes it does not seem to be changed.  We will refer to dermatology given his numerous skin lesions for exam and they can evaluate this lesion further.

## 2018-07-09 ENCOUNTER — Telehealth: Payer: Self-pay

## 2018-07-09 DIAGNOSIS — N179 Acute kidney failure, unspecified: Secondary | ICD-10-CM

## 2018-07-09 LAB — CBC
HCT: 46.2 % (ref 39.0–52.0)
Hemoglobin: 16.3 g/dL (ref 13.0–17.0)
MCHC: 35.3 g/dL (ref 30.0–36.0)
MCV: 98 fl (ref 78.0–100.0)
Platelets: 157 10*3/uL (ref 150.0–400.0)
RBC: 4.71 Mil/uL (ref 4.22–5.81)
RDW: 13.5 % (ref 11.5–15.5)
WBC: 5.7 10*3/uL (ref 4.0–10.5)

## 2018-07-09 LAB — BASIC METABOLIC PANEL
BUN: 17 mg/dL (ref 6–23)
CALCIUM: 9.8 mg/dL (ref 8.4–10.5)
CO2: 30 mEq/L (ref 19–32)
Chloride: 101 mEq/L (ref 96–112)
Creatinine, Ser: 1.34 mg/dL (ref 0.40–1.50)
GFR: 57.22 mL/min — AB (ref >60.00)
Glucose, Bld: 101 mg/dL — ABNORMAL HIGH (ref 70–99)
POTASSIUM: 4.4 meq/L (ref 3.5–5.1)
Sodium: 138 mEq/L (ref 135–145)

## 2018-07-09 LAB — TSH: TSH: 7.16 u[IU]/mL — AB (ref 0.35–4.50)

## 2018-07-09 LAB — T4, FREE: Free T4: 0.69 ng/dL (ref 0.60–1.60)

## 2018-07-09 NOTE — Telephone Encounter (Signed)
-----   Message from Leone Haven, MD sent at 07/09/2018 12:53 PM EDT ----- Please let the patient know that his kidney function is minimally worse than prior.  This potentially could be related to the meloxicam.  I would suggest we recheck this next week and if still abnormal will need to have him discontinue the meloxicam to see if it improves.  Please place an order for a BMP for AKI.  His TSH is elevated again.  His thyroid hormone is normal.  At this point I would recommend holding off on any treatment for subclinical hypothyroidism and plan to recheck in 6 months.  His other lab work is acceptable.  Thanks.

## 2018-07-09 NOTE — Telephone Encounter (Signed)
Last OV 07/08/18 last filled 04/02/18 30 2rf

## 2018-07-12 ENCOUNTER — Other Ambulatory Visit: Payer: Self-pay | Admitting: Family Medicine

## 2018-07-12 DIAGNOSIS — M1990 Unspecified osteoarthritis, unspecified site: Secondary | ICD-10-CM

## 2018-07-17 ENCOUNTER — Other Ambulatory Visit (INDEPENDENT_AMBULATORY_CARE_PROVIDER_SITE_OTHER): Payer: Managed Care, Other (non HMO)

## 2018-07-17 DIAGNOSIS — N179 Acute kidney failure, unspecified: Secondary | ICD-10-CM | POA: Diagnosis not present

## 2018-07-18 LAB — BASIC METABOLIC PANEL
BUN: 17 mg/dL (ref 6–23)
CHLORIDE: 104 meq/L (ref 96–112)
CO2: 29 meq/L (ref 19–32)
Calcium: 9.4 mg/dL (ref 8.4–10.5)
Creatinine, Ser: 1.2 mg/dL (ref 0.40–1.50)
GFR: 64.98 mL/min (ref >60.00)
GLUCOSE: 101 mg/dL — AB (ref 70–99)
POTASSIUM: 4.3 meq/L (ref 3.5–5.1)
SODIUM: 139 meq/L (ref 135–145)

## 2018-07-23 ENCOUNTER — Telehealth: Payer: Self-pay

## 2018-07-23 NOTE — Telephone Encounter (Signed)
See result note.  

## 2018-07-23 NOTE — Telephone Encounter (Signed)
Copied from [place] [id number]. Topic: Quick Communication - Office Called Patient >> Jul 23, 2018  9:06 AM Sheran Luz wrote: Reason for CRM: Pts wife Jackelyn Poling states that a Therapist, sports (could not remember name) called her yesterday regarding pt. She would like to know why. Not in CRM or Telephone encounter. Please advise.   Lucan Riner cb: [phone]

## 2018-09-06 ENCOUNTER — Other Ambulatory Visit: Payer: Self-pay | Admitting: Family Medicine

## 2018-09-06 MED ORDER — ALPRAZOLAM 0.5 MG PO TABS: ORAL_TABLET | ORAL | 1 refills | Status: DC

## 2018-09-06 NOTE — Progress Notes (Signed)
Controlled substance database reviewed.  Refill sent to pharmacy.  Patient's wife came to the office to request the refill.

## 2018-10-14 ENCOUNTER — Ambulatory Visit: Payer: Managed Care, Other (non HMO) | Admitting: Family Medicine

## 2018-10-14 ENCOUNTER — Encounter: Payer: Self-pay | Admitting: Family Medicine

## 2018-10-14 ENCOUNTER — Encounter

## 2018-10-14 DIAGNOSIS — E785 Hyperlipidemia, unspecified: Secondary | ICD-10-CM

## 2018-10-14 DIAGNOSIS — F419 Anxiety disorder, unspecified: Secondary | ICD-10-CM

## 2018-10-14 DIAGNOSIS — H66002 Acute suppurative otitis media without spontaneous rupture of ear drum, left ear: Secondary | ICD-10-CM | POA: Diagnosis not present

## 2018-10-14 DIAGNOSIS — H6690 Otitis media, unspecified, unspecified ear: Secondary | ICD-10-CM | POA: Insufficient documentation

## 2018-10-14 MED ORDER — DOXYCYCLINE HYCLATE 100 MG PO TABS: 100.0000 mg | ORAL_TABLET | Freq: Two times a day (BID) | ORAL | 0 refills | Status: DC

## 2018-10-14 NOTE — Assessment & Plan Note (Signed)
Adequately controlled.  Continue current regimen. 

## 2018-10-14 NOTE — Assessment & Plan Note (Signed)
Patient with apparent otitis media in his left ear.  Also with likely sinus infection.  He has a penicillin allergy.  He is unsure of the allergy he had when he was a child.  Given this we will plan to cover with doxycycline to provide coverage for sinusitis and otitis media.  If he is not improving over the next 3 to 4 days he will contact us.

## 2018-10-14 NOTE — Patient Instructions (Signed)
Nice to see you. We will treat you with doxycycline ear infection and possible sinus infection.  If you are not improving over the next 3 to 4 days please contact us.  You can also use Flonase to help with symptoms.

## 2018-10-14 NOTE — Assessment & Plan Note (Signed)
Continue simvastatin. 

## 2018-10-14 NOTE — Progress Notes (Signed)
  [name], MD Phone: [phone]  Jonathan Ingram is a 62 y.o. male who presents today for follow-up.  CC: Sinus congestion, anxiety, hyperlipidemia  Sinus congestion: Patient notes upper respiratory congestion and pressure in his sinuses for the last 3 to 4 days.  He does note some postnasal drip with cough.  Cough is productive of clear mucus.  He is blowing clear mucus out of his nose.  No fevers.  He notes his wife had similar symptoms.  He has tried Mucinex, Advil Cold and Sinus, and Alka-Seltzer.  Anxiety: Patient notes this is okay.  He takes Xanax once daily.  No drowsiness.  No depression.  No alcohol use.  Hyperlipidemia: Taking simvastatin.  No chest pain or shortness of breath.  He has chronic arthralgias which respond to meloxicam.  Social History   Tobacco Use  Smoking Status Current Some Day Smoker  . Packs/day: 0.50  . Years: 42.00  . Pack years: 21.00  . Types: Cigarettes  Smokeless Tobacco Never Used     ROS see history of present illness  Objective  Physical Exam Vitals:   10/14/18 1601  BP: 110/80  Pulse: 64  Temp: 97.7 F (36.5 C)  SpO2: 95%    BP Readings from Last 3 Encounters:  10/14/18 110/80  07/08/18 130/88  02/07/18 134/80   Wt Readings from Last 3 Encounters:  10/14/18 185 lb 9.6 oz (84.2 kg)  07/08/18 191 lb 6.4 oz (86.8 kg)  01/30/18 189 lb 6.4 oz (85.9 kg)    Physical Exam  Constitutional: No distress.  HENT:  Head: Normocephalic and atraumatic.  Posterior oropharynx with mild erythema, no exudate, left TM with erythema and purulence noted behind the TM, right TM obscured by cerumen  Eyes: Pupils are equal, round, and reactive to light. Conjunctivae are normal.  Cardiovascular: Normal rate, regular rhythm and normal heart sounds.  Pulmonary/Chest: Effort normal and breath sounds normal.  Musculoskeletal: He exhibits no edema.  Neurological: He is alert.  Skin: Skin is warm and dry. He is not diaphoretic.      Assessment/Plan: Please see individual problem list.  Anxiety Adequately controlled.  Continue current regimen.  Otitis media Patient with apparent otitis media in his left ear.  Also with likely sinus infection.  He has a penicillin allergy.  He is unsure of the allergy he had when he was a child.  Given this we will plan to cover with doxycycline to provide coverage for sinusitis and otitis media.  If he is not improving over the next 3 to 4 days he will contact us.  Hyperlipidemia Continue simvastatin.  No orders of the defined types were placed in this encounter.   Meds ordered this encounter  Medications  . doxycycline (VIBRA-TABS) 100 MG tablet    Sig: Take 1 tablet (100 mg total) by mouth 2 (two) times daily.    Dispense:  14 tablet    Refill:  0     [name], MD [place]

## 2018-12-20 ENCOUNTER — Ambulatory Visit: Payer: Self-pay | Admitting: *Deleted

## 2018-12-20 MED ORDER — OSELTAMIVIR PHOSPHATE 75 MG PO CAPS: 75.0000 mg | ORAL_CAPSULE | Freq: Two times a day (BID) | ORAL | 0 refills | Status: DC

## 2018-12-20 NOTE — Telephone Encounter (Signed)
Called and spoke with patient wife. 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 Wife stated that yesterday her husband got the flu shot and last night he just started to feel awful c/o body aches and feeling very fatigue wife stated that she does not feel well either.   They would like the Tamiful sent to [facility], [place].   Wife stated that she is unsure if he has had a fever or not.

## 2018-12-20 NOTE — Telephone Encounter (Signed)
Noted.  Please find out when his symptoms started.  He certainly could receive Tamiflu though he should be aware that this reduces the length of his symptoms by only about 24 hours.  There are potential risks to taking the Tamiflu with psychiatric issues such as confusion, delirium, and psychosis occurring in a very small portion of people who take Tamiflu.  If he understands the risks of the medication and would like to proceed with it I can prescribe it if his symptoms started in the last 48 hours.

## 2018-12-20 NOTE — Telephone Encounter (Signed)
Sent to pharmacy.  Please contact the patient and let him know that it is there.  If he does not start to feel better in the next several days he should contact us.  If he starts to feel worse or develops shortness of breath or high fever he needs to be evaluated.

## 2018-12-20 NOTE — Telephone Encounter (Signed)
Message from Luciana Axe sent at 12/20/2018 10:15 AM EST   Patient Wife, Hilda Blades is calling because they are raising their granddaughter that has the flu. The patient received a flu shot yesterday. The patient is not feeling good. His symptoms include cough, no fever, body aches, no energy. Wife is asking was it not a good idea to get the flu shot? And what can the patient do? Can he receive Tamiflu? Please advise. [phone]

## 2018-12-20 NOTE — Telephone Encounter (Signed)
Sent to PCP to advise 

## 2018-12-20 NOTE — Telephone Encounter (Signed)
Patient's wife advised on below.

## 2019-01-07 ENCOUNTER — Ambulatory Visit: Payer: Managed Care, Other (non HMO) | Admitting: Family Medicine

## 2019-01-07 ENCOUNTER — Encounter: Payer: Self-pay | Admitting: Family Medicine

## 2019-01-07 VITALS — BP 112/78 | HR 61 | Temp 97.7°F | Ht 69.0 in | Wt 190.0 lb

## 2019-01-07 DIAGNOSIS — F5104 Psychophysiologic insomnia: Secondary | ICD-10-CM

## 2019-01-07 DIAGNOSIS — F419 Anxiety disorder, unspecified: Secondary | ICD-10-CM

## 2019-01-07 DIAGNOSIS — Z1211 Encounter for screening for malignant neoplasm of colon: Secondary | ICD-10-CM

## 2019-01-07 DIAGNOSIS — J309 Allergic rhinitis, unspecified: Secondary | ICD-10-CM | POA: Diagnosis not present

## 2019-01-07 MED ORDER — TRIAMCINOLONE ACETONIDE 55 MCG/ACT NA AERO: 2.0000 | INHALATION_SPRAY | Freq: Every day | NASAL | 0 refills | Status: DC

## 2019-01-07 NOTE — Assessment & Plan Note (Signed)
Adequately controlled.  Continue current regimen. 

## 2019-01-07 NOTE — Patient Instructions (Signed)
Nice to see you. Please try the Nasacort for your allergies.  If this is not beneficial over the next 1 to 2 weeks please contact us and we can try Singulair.

## 2019-01-07 NOTE — Progress Notes (Signed)
[name], MD Phone: [phone]  Jonathan Ingram is a 63 y.o. male who presents today for follow-up.  CC: Allergic rhinitis, sleep difficulty, anxiety  Allergic rhinitis: Patient notes this has progressively gotten worse.  He has rhinorrhea, sneezing, watery eyes, postnasal drip, and minimal cough.  No significant congestion.  He is blowing clear mucus out of his nose.  No shortness of breath.  Flonase is not beneficial.  He is taking an over-the-counter antihistamine twice daily to help with this.  Sleep difficulty: Patient takes trazodone.  He notes this helps significantly.  If he does not take it he wakes up every hour or so.  He will sleep 9 to 10 hours while taking this.  No excessive drowsiness the next day.  Anxiety: He notes this is relatively well controlled.  Notes it is up and down as he is raising a 25-year-old.  He notes no drowsiness with the Xanax.  He takes it right before bed.  He does not drink alcohol near the time that he is taking the Xanax.  Social History   Tobacco Use  Smoking Status Current Some Day Smoker  . Packs/day: 0.50  . Years: 42.00  . Pack years: 21.00  . Types: Cigarettes  Smokeless Tobacco Never Used     ROS see history of present illness  Objective  Physical Exam Vitals:   01/07/19 1135  BP: 112/78  Pulse: 61  Temp: 97.7 F (36.5 C)  SpO2: 96%    BP Readings from Last 3 Encounters:  01/07/19 112/78  10/14/18 110/80  07/08/18 130/88   Wt Readings from Last 3 Encounters:  01/07/19 190 lb (86.2 kg)  10/14/18 185 lb 9.6 oz (84.2 kg)  07/08/18 191 lb 6.4 oz (86.8 kg)    Physical Exam Constitutional:      General: He is not in acute distress.    Appearance: He is not diaphoretic.  HENT:     Head: Normocephalic and atraumatic.     Mouth/Throat:     Mouth: Mucous membranes are moist.     Pharynx: Oropharynx is clear.  Eyes:     Conjunctiva/sclera: Conjunctivae normal.     Pupils: Pupils are equal, round, and reactive  to light.  Cardiovascular:     Rate and Rhythm: Normal rate and regular rhythm.     Heart sounds: Normal heart sounds.  Pulmonary:     Effort: Pulmonary effort is normal.     Breath sounds: Normal breath sounds.  Skin:    General: Skin is warm and dry.  Neurological:     Mental Status: He is alert.      Assessment/Plan: Please see individual problem list.  Allergic rhinitis Symptoms are consistent with allergic rhinitis.  We will trial Nasacort.  Discussed taking his antihistamine once daily.  If this is not beneficial we can trial Singulair.  Would refer to ENT if he continues to have symptoms despite these medications.  Anxiety Adequately controlled.  Continue current regimen.  Insomnia Adequately controlled.  Continue current regimen.   Health Maintenance: Cologuard ordered for colon cancer screening.  He notes no family history of colon cancer.  No prior colonoscopy.  No blood in his stool.  I did discuss that his insurance may not pay for a colonoscopy if needed following a positive Cologuard test given that colonoscopy would be for diagnostic purposes.  He voiced understanding.  He will check with insurance regarding coverage of Cologuard.  Orders Placed This Encounter  Procedures  . Cologuard  Meds ordered this encounter  Medications  . triamcinolone (NASACORT) 55 MCG/ACT AERO nasal inhaler    Sig: Place 2 sprays into the nose daily.    Dispense:  1 Inhaler    Refill:  0     [name], MD [place]

## 2019-01-07 NOTE — Assessment & Plan Note (Signed)
Symptoms are consistent with allergic rhinitis.  We will trial Nasacort.  Discussed taking his antihistamine once daily.  If this is not beneficial we can trial Singulair.  Would refer to ENT if he continues to have symptoms despite these medications.

## 2019-02-27 ENCOUNTER — Other Ambulatory Visit: Payer: Self-pay | Admitting: Family Medicine

## 2019-02-27 DIAGNOSIS — G4700 Insomnia, unspecified: Secondary | ICD-10-CM

## 2019-02-27 NOTE — Telephone Encounter (Signed)
Last OV 01/07/2019  Next appt 04/14/2019

## 2019-03-14 ENCOUNTER — Other Ambulatory Visit: Payer: Self-pay | Admitting: Family Medicine

## 2019-03-17 NOTE — Telephone Encounter (Signed)
Last OV 01/07/2019  Last refilled 09/06/2018 disp 90 with 1 refill   Next OV 04/14/2019  Sent to PCP for approval

## 2019-04-14 ENCOUNTER — Ambulatory Visit (INDEPENDENT_AMBULATORY_CARE_PROVIDER_SITE_OTHER): Payer: Managed Care, Other (non HMO) | Admitting: Family Medicine

## 2019-04-14 ENCOUNTER — Other Ambulatory Visit: Payer: Self-pay

## 2019-04-14 ENCOUNTER — Encounter: Payer: Self-pay | Admitting: Family Medicine

## 2019-04-14 DIAGNOSIS — M19041 Primary osteoarthritis, right hand: Secondary | ICD-10-CM

## 2019-04-14 DIAGNOSIS — F419 Anxiety disorder, unspecified: Secondary | ICD-10-CM

## 2019-04-14 DIAGNOSIS — R7989 Other specified abnormal findings of blood chemistry: Secondary | ICD-10-CM | POA: Diagnosis not present

## 2019-04-14 DIAGNOSIS — Z1211 Encounter for screening for malignant neoplasm of colon: Secondary | ICD-10-CM

## 2019-04-14 DIAGNOSIS — E785 Hyperlipidemia, unspecified: Secondary | ICD-10-CM

## 2019-04-14 DIAGNOSIS — F5104 Psychophysiologic insomnia: Secondary | ICD-10-CM

## 2019-04-14 DIAGNOSIS — M19042 Primary osteoarthritis, left hand: Secondary | ICD-10-CM

## 2019-04-14 NOTE — Progress Notes (Signed)
Virtual Visit via video Note  This visit type was conducted due to national recommendations for restrictions regarding the COVID-19 pandemic (e.g. social distancing).  This format is felt to be most appropriate for this patient at this time.  All issues noted in this document were discussed and addressed.  No physical exam was performed (except for noted visual exam findings with Video Visits).   I connected with Jonathan Ingram today at  2:45 PM EDT by a video enabled telemedicine application and verified that I am speaking with the correct person using two identifiers. Location patient: home Location provider: work or home office Persons participating in the virtual visit: patient, provider  I discussed the limitations, risks, security and privacy concerns of performing an evaluation and management service by telephone and the availability of in person appointments. I also discussed with the patient that there may be a patient responsible charge related to this service. The patient expressed understanding and agreed to proceed.  Reason for visit: Follow-up.  HPI: Hyperlipidemia: Taking simvastatin.  No chest pain.  No right upper quadrant pain or myalgias.  Anxiety/depression: Patient notes no anxiety or depression.  Insomnia: He continues on trazodone and Xanax.  He does sleep well with this combination.  He gets 9 hours of sleep.  No drowsiness or alcohol intake.  Osteoarthritis: No longer on medication for this.  He notes he just deals with it.  Occasionally bothers him on rainy days.  Elevated TSH: No skin changes.  No heat or cold intolerance.  Colon cancer screening: Patient notes no blood in his stool.  No family history of colon cancer.  No personal history of a colonoscopy.  He notes he never got the Cologuard.   ROS: See pertinent positives and negatives per HPI.  Past Medical History:  Diagnosis Date  . Allergy   . Anxiety   . Chicken pox   . Depression   .  Hyperlipidemia   . Thrombocytopenia ([place])     Past Surgical History:  Procedure Laterality Date  . NO PAST SURGERIES      Family History  Problem Relation Age of Onset  . Hypertension Mother   . Bladder Cancer Mother   . Clotting disorder Mother   . Clotting disorder Brother   . Hypertension Maternal Uncle   . Bladder Cancer Maternal Uncle   . Leukemia Paternal Uncle   . Liver cancer Maternal Grandmother     SOCIAL HX: Smoker   Current Outpatient Medications:  .  ALPRAZolam (XANAX) 0.5 MG tablet, TAKE 1 TABLET BY MOUTH AT BEDTIME AS NEEDED FOR ANXIETY, Disp: 90 tablet, Rfl: 0 .  meloxicam (MOBIC) 15 MG tablet, take 1 tablet by mouth once daily, Disp: 30 tablet, Rfl: 0 .  simvastatin (ZOCOR) 20 MG tablet, Take 1 tablet (20 mg total) by mouth daily., Disp: 90 tablet, Rfl: 3 .  traZODone (DESYREL) 150 MG tablet, TAKE 1 TABLET BY MOUTH AT BEDTIME IF NEEDED FOR SLEEP, Disp: 90 tablet, Rfl: 3 .  triamcinolone (NASACORT) 55 MCG/ACT AERO nasal inhaler, Place 2 sprays into the nose daily., Disp: 1 Inhaler, Rfl: 0  EXAM:  VITALS per patient if applicable: None.  GENERAL: alert, oriented, appears well and in no acute distress  HEENT: atraumatic, conjunttiva clear, no obvious abnormalities on inspection of external nose and ears  NECK: normal movements of the head and neck  LUNGS: on inspection no signs of respiratory distress, breathing rate appears normal, no obvious gross SOB, gasping or wheezing  CV:  no obvious cyanosis  MS: moves all visible extremities without noticeable abnormality  PSYCH/NEURO: pleasant and cooperative, no obvious depression or anxiety, speech and thought processing grossly intact  ASSESSMENT AND PLAN:  Discussed the following assessment and plan:  Primary osteoarthritis of both hands  Colon cancer screening - Plan: Cologuard, CANCELED: Cologuard  Anxiety  Elevated TSH  Hyperlipidemia, unspecified hyperlipidemia type  Psychophysiological  insomnia  Osteoarthritis Stable.  He will monitor off of medication.  Anxiety Asymptomatic.  He will monitor.  Elevated TSH Due for recheck.  Hyperlipidemia Continue simvastatin.  He will come in for lab work.  Insomnia Continue current regimen.  Colon cancer screening Cologuard ordered.  Order printed and placed on CMA's desk to fax.  CMA will contact the patient to schedule labs in 3 to 4 weeks.  She will also get him set up for 62-month follow-up.  Social distancing precautions and sick precautions given regarding COVID-19.   I discussed the assessment and treatment plan with the patient. The patient was provided an opportunity to ask questions and all were answered. The patient agreed with the plan and demonstrated an understanding of the instructions.   The patient was advised to call back or seek an in-person evaluation if the symptoms worsen or if the condition fails to improve as anticipated.   [name], MD

## 2019-04-15 ENCOUNTER — Telehealth: Payer: Self-pay | Admitting: Family Medicine

## 2019-04-15 DIAGNOSIS — Z1211 Encounter for screening for malignant neoplasm of colon: Secondary | ICD-10-CM | POA: Insufficient documentation

## 2019-04-15 NOTE — Assessment & Plan Note (Signed)
Stable.  He will monitor off of medication.

## 2019-04-15 NOTE — Assessment & Plan Note (Signed)
Cologuard ordered.  Order printed and placed on CMA's desk to fax.

## 2019-04-15 NOTE — Assessment & Plan Note (Signed)
Asymptomatic. He will monitor. ?

## 2019-04-15 NOTE — Assessment & Plan Note (Signed)
Continue simvastatin.  He will come in for lab work.

## 2019-04-15 NOTE — Assessment & Plan Note (Signed)
Continue current regimen

## 2019-04-15 NOTE — Assessment & Plan Note (Signed)
Due for recheck

## 2019-04-15 NOTE — Telephone Encounter (Signed)
Please contact the patient and get him set up for lab work in 3 to 4 weeks.  He also needs a follow-up in the office in 6 months.  Order has been placed.  Additionally I placed a Cologuard order on your desk.  Please fax this to Cologuard.

## 2019-04-16 NOTE — Telephone Encounter (Signed)
Called pt and left a VM to call back. CRM created and sent to [facility] pool. 

## 2019-04-16 NOTE — Telephone Encounter (Signed)
Called pt and left a VM to call me back directly.

## 2019-04-16 NOTE — Telephone Encounter (Signed)
Fax was sent to cologuard [phone]

## 2019-04-17 NOTE — Telephone Encounter (Signed)
Called pt and left a VM to call back.  

## 2019-04-18 NOTE — Telephone Encounter (Signed)
Pt has been scheduled for appts that are needed

## 2019-05-07 ENCOUNTER — Other Ambulatory Visit (INDEPENDENT_AMBULATORY_CARE_PROVIDER_SITE_OTHER): Payer: Managed Care, Other (non HMO)

## 2019-05-07 ENCOUNTER — Other Ambulatory Visit: Payer: Self-pay

## 2019-05-07 DIAGNOSIS — R7989 Other specified abnormal findings of blood chemistry: Secondary | ICD-10-CM | POA: Diagnosis not present

## 2019-05-07 DIAGNOSIS — E785 Hyperlipidemia, unspecified: Secondary | ICD-10-CM | POA: Diagnosis not present

## 2019-05-07 LAB — COMPREHENSIVE METABOLIC PANEL
ALT: 19 U/L (ref 0–53)
AST: 15 U/L (ref 0–37)
Albumin: 4.5 g/dL (ref 3.5–5.2)
Alkaline Phosphatase: 60 U/L (ref 39–117)
BUN: 16 mg/dL (ref 6–23)
CO2: 30 mEq/L (ref 19–32)
Calcium: 9.2 mg/dL (ref 8.4–10.5)
Chloride: 104 mEq/L (ref 96–112)
Creatinine, Ser: 1.07 mg/dL (ref 0.40–1.50)
GFR: 69.61 mL/min (ref >60.00)
Glucose, Bld: 107 mg/dL — ABNORMAL HIGH (ref 70–99)
Potassium: 4.5 mEq/L (ref 3.5–5.1)
Sodium: 140 mEq/L (ref 135–145)
Total Bilirubin: 0.8 mg/dL (ref 0.2–1.2)
Total Protein: 6.3 g/dL (ref 6.0–8.3)

## 2019-05-07 LAB — LDL CHOLESTEROL, DIRECT: Direct LDL: 115 mg/dL

## 2019-05-07 LAB — T4, FREE: Free T4: 0.6 ng/dL (ref 0.60–1.60)

## 2019-05-07 LAB — LIPID PANEL
Cholesterol: 183 mg/dL (ref 0–200)
HDL: 38.5 mg/dL — ABNORMAL LOW (ref >39.00)
NonHDL: 144.84
Total CHOL/HDL Ratio: 5
Triglycerides: 226 mg/dL — ABNORMAL HIGH (ref 0.0–149.0)
VLDL: 45.2 mg/dL — ABNORMAL HIGH (ref 0.0–40.0)

## 2019-05-07 LAB — TSH: TSH: 4.84 u[IU]/mL — ABNORMAL HIGH (ref 0.35–4.50)

## 2019-05-15 ENCOUNTER — Other Ambulatory Visit: Payer: Self-pay | Admitting: Family Medicine

## 2019-05-15 DIAGNOSIS — E782 Mixed hyperlipidemia: Secondary | ICD-10-CM

## 2019-05-15 MED ORDER — SIMVASTATIN 40 MG PO TABS: 40.0000 mg | ORAL_TABLET | Freq: Every day | ORAL | 1 refills | Status: DC

## 2019-06-03 ENCOUNTER — Telehealth: Payer: Self-pay

## 2019-06-03 NOTE — Telephone Encounter (Signed)
lmtcb to clarify if cologuard kit was delivered and to see if patient will do the test.  Jonathan Ingram,cma

## 2019-06-19 ENCOUNTER — Other Ambulatory Visit: Payer: Self-pay | Admitting: Family Medicine

## 2019-07-08 ENCOUNTER — Other Ambulatory Visit: Payer: Managed Care, Other (non HMO)

## 2019-07-22 ENCOUNTER — Other Ambulatory Visit: Payer: Self-pay

## 2019-07-22 ENCOUNTER — Other Ambulatory Visit (INDEPENDENT_AMBULATORY_CARE_PROVIDER_SITE_OTHER): Payer: Managed Care, Other (non HMO)

## 2019-07-22 DIAGNOSIS — E782 Mixed hyperlipidemia: Secondary | ICD-10-CM | POA: Diagnosis not present

## 2019-07-22 LAB — HEPATIC FUNCTION PANEL
ALT: 22 U/L (ref 0–53)
AST: 16 U/L (ref 0–37)
Albumin: 4.8 g/dL (ref 3.5–5.2)
Alkaline Phosphatase: 59 U/L (ref 39–117)
Bilirubin, Direct: 0.1 mg/dL (ref 0.0–0.3)
Total Bilirubin: 0.8 mg/dL (ref 0.2–1.2)
Total Protein: 6.5 g/dL (ref 6.0–8.3)

## 2019-07-22 LAB — LIPID PANEL
Cholesterol: 180 mg/dL (ref 0–200)
HDL: 38.8 mg/dL — ABNORMAL LOW (ref >39.00)
LDL Cholesterol: 103 mg/dL — ABNORMAL HIGH (ref 0–99)
NonHDL: 140.92
Total CHOL/HDL Ratio: 5
Triglycerides: 188 mg/dL — ABNORMAL HIGH (ref 0.0–149.0)
VLDL: 37.6 mg/dL (ref 0.0–40.0)

## 2019-07-25 ENCOUNTER — Telehealth: Payer: Self-pay | Admitting: Family Medicine

## 2019-07-25 DIAGNOSIS — E785 Hyperlipidemia, unspecified: Secondary | ICD-10-CM

## 2019-07-25 NOTE — Telephone Encounter (Signed)
Patient wife is calling back for lab results [phone]

## 2019-07-29 MED ORDER — ROSUVASTATIN CALCIUM 40 MG PO TABS: 40.0000 mg | ORAL_TABLET | Freq: Every day | ORAL | 3 refills | Status: DC

## 2019-07-29 NOTE — Telephone Encounter (Signed)
Crestor sent to pharmacy. Patient will need lab work in 6 weeks. Please get this scheduled for him. Orders placed.

## 2019-07-29 NOTE — Telephone Encounter (Signed)
Patient's wife called and she stated they picked up the medication and she scheduled a lab appointment for the patient.  Raianna Slight,cma

## 2019-07-31 ENCOUNTER — Other Ambulatory Visit: Payer: Self-pay | Admitting: Family Medicine

## 2019-08-01 NOTE — Telephone Encounter (Signed)
Last OV 04/14/19 last fill 06/19/19

## 2019-08-05 ENCOUNTER — Telehealth: Payer: Self-pay | Admitting: Family Medicine

## 2019-08-05 NOTE — Telephone Encounter (Signed)
Wife would also like to know if Dr Josephina Gip would be able lance or open up the lump to clean it out or will pt need to be referred?

## 2019-08-05 NOTE — Telephone Encounter (Signed)
Pt wife called about Pimple on back for years/used to squeeze white stuff would come out now odor/puss with blood.And painful/ swollen. No appt avail. Can pt be scheduled for tomorrow at 4?  Pt call wife @ [phone]. Thank you!

## 2019-08-06 NOTE — Telephone Encounter (Signed)
Scheduled patient for Friday at 4 PM in office, no covid symptoms no exposure, Advised patient would need evaluation of the area before being able to decide whether to send to dermatology or next steps , patient has a pimple on back that has been there for years but is now getting larger. No fever, chills and redness located just to area of bump.

## 2019-08-06 NOTE — Telephone Encounter (Signed)
Noted. Plan to evaluate in the office.

## 2019-08-06 NOTE — Telephone Encounter (Signed)
Pt wife calling back to check status. Please advise

## 2019-08-08 ENCOUNTER — Encounter: Payer: Self-pay | Admitting: Family Medicine

## 2019-08-08 ENCOUNTER — Ambulatory Visit: Payer: Managed Care, Other (non HMO) | Admitting: Family Medicine

## 2019-08-08 ENCOUNTER — Other Ambulatory Visit: Payer: Self-pay

## 2019-08-08 VITALS — BP 140/80 | HR 78 | Temp 98.6°F | Ht 69.0 in | Wt 186.6 lb

## 2019-08-08 DIAGNOSIS — L723 Sebaceous cyst: Secondary | ICD-10-CM | POA: Diagnosis not present

## 2019-08-08 DIAGNOSIS — Z23 Encounter for immunization: Secondary | ICD-10-CM | POA: Diagnosis not present

## 2019-08-08 MED ORDER — DOXYCYCLINE HYCLATE 100 MG PO TABS: 100.0000 mg | ORAL_TABLET | Freq: Two times a day (BID) | ORAL | 0 refills | Status: DC

## 2019-08-08 NOTE — Patient Instructions (Signed)
Nice to see you. Please take the antibiotic.  Please use warm compresses on this. If you develop spreading redness, increasing pain, fevers, enlarging lesion, or any new or changing symptoms please seek medical attention at the emergency room.

## 2019-08-08 NOTE — Assessment & Plan Note (Signed)
Given exam concern is for an inflamed/infected sebaceous cyst.  He does report purulent material has been expressed from the area at times over the last several days.  There is no obvious underlying fluctuant area to incise.  Given the location and the size of this area I would like for him to see a general surgeon to consider incision and drainage in the office.  He is hemodynamically stable.  We will start him on doxycycline orally.  We will arrange for an appointment with general surgery early next week.  I advised the patient that if it becomes increasingly red, swollen, painful, or if he develops fevers or any new symptoms he should be evaluated in the emergency department over the weekend.

## 2019-08-08 NOTE — Progress Notes (Signed)
  [name], MD Phone: [phone]  Jonathan Ingram is a 63 y.o. male who presents today for same-day visit.  Cyst: Patient notes he has had a cyst on his left mid back for quite some time.  He notes over the last 5 days it is become sore and red.  He notes his wife would squeeze on it and would get purulent material out.  He notes he took a hot shower yesterday and since then it has been improved to a certain degree today with less pain.  He has had no fevers.  Social History   Tobacco Use  Smoking Status Current Some Day Smoker  . Packs/day: 0.50  . Years: 42.00  . Pack years: 21.00  . Types: Cigarettes  Smokeless Tobacco Never Used     ROS see history of present illness  Objective  Physical Exam Vitals:   08/08/19 1605  BP: 140/80  Pulse: 78  Temp: 98.6 F (37 C)  SpO2: 98%    BP Readings from Last 3 Encounters:  08/08/19 140/80  01/07/19 112/78  10/14/18 110/80   Wt Readings from Last 3 Encounters:  08/08/19 186 lb 9.6 oz (84.6 kg)  01/07/19 190 lb (86.2 kg)  10/14/18 185 lb 9.6 oz (84.2 kg)    Physical Exam Constitutional:      General: He is not in acute distress.    Appearance: He is not diaphoretic.  Pulmonary:     Effort: Pulmonary effort is normal.  Skin:    General: Skin is warm and dry.  Neurological:     Mental Status: He is alert.    Over his left mid back, there are 3 small white blister lesions though there is no apparent underlying fluctuant area, there is induration, there is mild warmth    Assessment/Plan: Please see individual problem list.  Inflamed sebaceous cyst Given exam concern is for an inflamed/infected sebaceous cyst.  He does report purulent material has been expressed from the area at times over the last several days.  There is no obvious underlying fluctuant area to incise.  Given the location and the size of this area I would like for him to see a general surgeon to consider incision and drainage in the office.  He  is hemodynamically stable.  We will start him on doxycycline orally.  We will arrange for an appointment with general surgery early next week.  I advised the patient that if it becomes increasingly red, swollen, painful, or if he develops fevers or any new symptoms he should be evaluated in the emergency department over the weekend.    Orders Placed This Encounter  Procedures  . Flu Vaccine QUAD 36+ mos IM  . Ambulatory referral to General Surgery    Referral Priority:   Urgent    Referral Type:   Surgical    Referral Reason:   Specialty Services Required    Requested Specialty:   General Surgery    Number of Visits Requested:   1    Meds ordered this encounter  Medications  . doxycycline (VIBRA-TABS) 100 MG tablet    Sig: Take 1 tablet (100 mg total) by mouth 2 (two) times daily.    Dispense:  14 tablet    Refill:  0     [name], MD [place]

## 2019-08-12 ENCOUNTER — Ambulatory Visit: Payer: Managed Care, Other (non HMO) | Admitting: Surgery

## 2019-08-12 ENCOUNTER — Encounter: Payer: Self-pay | Admitting: Surgery

## 2019-08-12 ENCOUNTER — Other Ambulatory Visit: Payer: Self-pay

## 2019-08-12 VITALS — BP 146/84 | HR 66 | Temp 97.9°F | Resp 16 | Ht 69.0 in | Wt 187.6 lb

## 2019-08-12 DIAGNOSIS — L723 Sebaceous cyst: Secondary | ICD-10-CM | POA: Diagnosis not present

## 2019-08-12 NOTE — Progress Notes (Signed)
08/12/2019  Reason for Visit:  Infected sebaceous cyst  Referring Provider:  [name], MD  History of Present Illness: Jonathan Ingram is a 63 y.o. male presenting for evaluation of an infected cyst of his mid back.  He reports he's had issues with this area several times in the past.  This last episode started about a week ago and it started building pressure, redness, and pain.  He saw his PCP on 9/11 and was given prescription for Doxycycline and referred to [place].  He reports that his cyst burst on 9/13 and feels the pressure and pain are much less, but he has burning and itching sensation still.  The redness has improved.  Denies ever having any fevers, chills, chest pain, shortness of breath.  Past Medical History: Past Medical History:  Diagnosis Date  . Allergy   . Anxiety   . Chicken pox   . Depression   . Hyperlipidemia   . Thrombocytopenia ([place])      Past Surgical History: Past Surgical History:  Procedure Laterality Date  . NO PAST SURGERIES      Home Medications: Prior to Admission medications   Medication Sig Start Date End Date Taking? Authorizing Provider  ALPRAZolam Duanne Moron) 0.5 MG tablet TAKE 1 TABLET BY MOUTH AT BEDTIME AS NEEDED FOR ANXIETY 08/01/19  Yes Leone Haven, MD  doxycycline (VIBRA-TABS) 100 MG tablet Take 1 tablet (100 mg total) by mouth 2 (two) times daily. 08/08/19  Yes Leone Haven, MD  meloxicam [facility]) 15 MG tablet take 1 tablet by mouth once daily 07/09/18  Yes Leone Haven, MD  rosuvastatin (CRESTOR) 40 MG tablet Take 1 tablet (40 mg total) by mouth daily. 07/29/19  Yes Leone Haven, MD  simvastatin (ZOCOR) 20 MG tablet  07/26/19  Yes [provider]  traZODone (DESYREL) 150 MG tablet TAKE 1 TABLET BY MOUTH AT BEDTIME IF NEEDED FOR SLEEP 02/27/19  Yes Leone Haven, MD  triamcinolone (NASACORT) 55 MCG/ACT AERO nasal inhaler Place 2 sprays into the nose daily. 01/07/19  Yes Leone Haven, MD     Allergies: Allergies  Allergen Reactions  . Penicillins Other (See Comments)    Allergic as child, not sure of reaction    Social History:  reports that he has been smoking cigarettes. He has a 21.00 pack-year smoking history. He has never used smokeless tobacco. He reports current alcohol use of about 1.0 standard drinks of alcohol per week. He reports that he does not use drugs.   Family History: Family History  Problem Relation Age of Onset  . Hypertension Mother   . Bladder Cancer Mother   . Clotting disorder Mother   . Clotting disorder Brother   . Hypertension Maternal Uncle   . Bladder Cancer Maternal Uncle   . Leukemia Paternal Uncle   . Liver cancer Maternal Grandmother     Review of Systems: Review of Systems  Constitutional: Negative for chills and fever.  Respiratory: Negative for shortness of breath.   Cardiovascular: Negative for chest pain.  Gastrointestinal: Negative for abdominal pain, nausea and vomiting.  Genitourinary: Negative for dysuria.  Musculoskeletal: Negative for myalgias.  Skin: Positive for itching.       Infected cyst of the mid back  Neurological: Negative for dizziness.  Psychiatric/Behavioral: Negative for depression.    Physical Exam BP (!) 146/84   Pulse 66   Temp 97.9 F (36.6 C) (Temporal)   Resp 16   Ht 5\' 9"  (1.753 m)  Wt 187 lb 9.6 oz (85.1 kg)   SpO2 99%   BMI 27.70 kg/m  CONSTITUTIONAL: No acute distress HEENT:  Normocephalic, atraumatic, extraocular motion intact. NECK: Trachea is midline, and there is no jugular venous distension.  RESPIRATORY:  Lungs are clear, and breath sounds are equal bilaterally. Normal respiratory effort without pathologic use of accessory muscles. CARDIOVASCULAR: Heart is regular without murmurs, gallops, or rubs. GI: The abdomen is soft, nondistended, nontender.  MUSCULOSKELETAL:  Normal muscle strength and tone in all four extremities.  No peripheral edema or cyanosis. SKIN: The  patient has an area of the left mid back with some induration and erythema measuring about 3-4 cm in diameter.  There is a smaller 1 cm area of raw skin likely were the drainage occurred from.  Upon manual pressure, there is still some purulent fluid draining.  There is fluctuance on palpation.  NEUROLOGIC:  Motor and sensation is grossly normal.  Cranial nerves are grossly intact. PSYCH:  Alert and oriented to person, place and time. Affect is normal.  Laboratory Analysis: No results found for this or any previous visit (from the past 24 hour(s)).  Imaging: No results found.  Assessment and Plan: This is a 63 y.o. male with an infected sebaceous cyst of the left mid back.  Discussed with him that although his symptoms have improved, there is still some purulent fluid in the cavity and he would benefit from I&D to allow for full drainage so the infection does not recur when he finishes the antibiotics.  Discussed with him the risks of bleeding, further procedures, and injury to surrounding structures.  He's willing to proceed.   Procedure Date:  08/12/2019  Pre-operative Diagnosis:  Infected sebaceous cyst of the left mid back  Post-operative Diagnosis:  Infected sebaceous cyst of the left mid back  Procedure:  Incision and Drainage of infected sebaceous cyst of the left mid back  Surgeon:  Melvyn Neth, MD  Assistant:  Romie Minus, PA-S  Anesthesia:  3 ml 1% lidocaine with epi  Estimated Blood Loss:  2 ml  Specimens:  Culture swab of purulent fluid  Complications:  None  Indications for Procedure:  This is a 63 y.o. male with diagnosis of infected sebaceous cyst of the left mid back, requiring drainage procedure.  The risks of bleeding, abscess or infection, injury to surrounding structures, and need for further procedures were all discussed with the patient and was willing to proceed.  Description of Procedure: The patient was correctly identified at bedside.   Appropriate time-outs were performed prior to procedure.  The patient's mid back was prepped and draped in usual sterile fashion.  Local anesthetic was infused intradermally.  A 1.5 cm incision was made over the abscess, revealing purulent fluid.  This fluid was swabbed for culture and sent to micro.  Small Kelly forceps were used to dissect around the abscess tissue to open any remaining pockets of purulent fluid.  Some of the wall of the sebaceous cyst was also removed from the cavity.  After drainage was completed, the cavity was irrigated and cleaned.  The wound was packed with 1/4 inch gauze and covered with dry gauze and tape.  The patient tolerated the procedure well and all sharps were appropriately disposed of at the end of the case.   --Wound care discussed with the patient and his wife.  Will remove wick of gauze on 9/17 and then may shower.  Will do packing dressing changes with 1/4 inch gauze daily. --  Continue taking the Doxycycline.  Will call patient if need to change antibiotic due to culture results --Follow up in 1 week to check on the wound. --Discussed possibility of formal cyst excision in the future once the wound is fully healed.   Melvyn Neth, [facility]

## 2019-08-12 NOTE — Patient Instructions (Addendum)
Thursday remove the old packing, shower as usual, washing the area with soap and water. Pat dry and inset the packing into the wound. You will notice that the wound will take less packing daily .   You may try tylenol or ibuprofen for pain or discomfort.   Please see your follow up appointment listed below.

## 2019-08-18 LAB — ANAEROBIC AND AEROBIC CULTURE

## 2019-08-19 ENCOUNTER — Other Ambulatory Visit: Payer: Self-pay

## 2019-08-19 ENCOUNTER — Ambulatory Visit (INDEPENDENT_AMBULATORY_CARE_PROVIDER_SITE_OTHER): Payer: Managed Care, Other (non HMO) | Admitting: Surgery

## 2019-08-19 ENCOUNTER — Encounter: Payer: Self-pay | Admitting: Surgery

## 2019-08-19 VITALS — BP 133/80 | HR 64 | Temp 97.7°F | Resp 14 | Ht 69.0 in | Wt 188.0 lb

## 2019-08-19 DIAGNOSIS — L723 Sebaceous cyst: Secondary | ICD-10-CM

## 2019-08-19 DIAGNOSIS — Z09 Encounter for follow-up examination after completed treatment for conditions other than malignant neoplasm: Secondary | ICD-10-CM

## 2019-08-19 NOTE — Patient Instructions (Signed)
Continue to wash the area with soap and water. Place a dry dressing over the area until it heals completely.

## 2019-08-19 NOTE — Progress Notes (Signed)
08/19/2019   HPI: Jonathan Ingram is a 63 y.o. male s/p I&D of infected sebaceous cyst of the back on 9/15.  He presents today for follow up.  Reports doing well.  It is sore to pack the wound so he stopped doing that.  Denies any further purulent drainage.  Denies any pain, but reports the area itches a lot.  Antibiotic course completed.  Vital signs: BP 133/80   Pulse 64   Temp 97.7 F (36.5 C) (Temporal)   Resp 14   Ht 5\' 9"  (1.753 m)   Wt 188 lb (85.3 kg)   SpO2 96%   BMI 27.76 kg/m    Physical Exam: Constitutional: No acute distress Skin:  Back I&D site with mild erythema, no induration, and with wound healing well.  Qtip probed the wound to make sure no further purulent fluid -- there was none.  Dressed with dry gauze and tape.  Assessment/Plan: This is a 63 y.o. male s/p I&D of infected back sebaceous cyst.  Discussed with patient that he can continue with dry dressing changes until healed, no need for packing at this point.  If any worsening, he needs to call us so we can re-evaluate the wound.  Otherwise, will follow up in 1 month for in-office procedure to excise the cyst.   Melvyn Neth, MD [facility]

## 2019-09-23 ENCOUNTER — Ambulatory Visit: Payer: Self-pay | Admitting: Surgery

## 2019-09-29 ENCOUNTER — Other Ambulatory Visit: Payer: Managed Care, Other (non HMO)

## 2019-10-03 ENCOUNTER — Ambulatory Visit: Payer: Managed Care, Other (non HMO) | Admitting: Family Medicine

## 2019-10-08 ENCOUNTER — Ambulatory Visit: Payer: Managed Care, Other (non HMO) | Admitting: Family Medicine

## 2019-10-20 ENCOUNTER — Ambulatory Visit: Payer: Managed Care, Other (non HMO) | Admitting: Family Medicine

## 2019-11-18 ENCOUNTER — Telehealth: Payer: Self-pay

## 2019-11-18 MED ORDER — ALPRAZOLAM 0.5 MG PO TABS: ORAL_TABLET | ORAL | 1 refills | Status: DC

## 2019-11-18 NOTE — Telephone Encounter (Signed)
Sent to pharmacy 

## 2020-03-21 ENCOUNTER — Other Ambulatory Visit: Payer: Self-pay | Admitting: Family Medicine

## 2020-03-21 DIAGNOSIS — G4700 Insomnia, unspecified: Secondary | ICD-10-CM

## 2020-05-31 ENCOUNTER — Other Ambulatory Visit: Payer: Self-pay | Admitting: Family Medicine

## 2020-06-01 NOTE — Telephone Encounter (Signed)
He needs a follow-up visit to discuss this refill. Please call to get him scheduled. Thanks.

## 2020-06-01 NOTE — Telephone Encounter (Signed)
Refill request for xanax, last seen 08-08-19, last filled 11-18-19.  Please advise.

## 2020-06-01 NOTE — Telephone Encounter (Signed)
Unable to leave message for patient to return call back. Patient vm box is full. Patient need appointment to discuss medication refill. mychart has also been sent.

## 2020-06-02 ENCOUNTER — Telehealth: Payer: Self-pay | Admitting: Family Medicine

## 2020-06-02 DIAGNOSIS — E785 Hyperlipidemia, unspecified: Secondary | ICD-10-CM

## 2020-06-02 MED ORDER — ROSUVASTATIN CALCIUM 40 MG PO TABS: 40.0000 mg | ORAL_TABLET | Freq: Every day | ORAL | 3 refills | Status: DC

## 2020-06-02 NOTE — Telephone Encounter (Signed)
Patient needs a refill on his rosuvastatin (CRESTOR) 40 MG tablet. [facility] on [address].

## 2020-08-02 ENCOUNTER — Other Ambulatory Visit: Payer: Self-pay | Admitting: Family Medicine

## 2020-08-02 DIAGNOSIS — E785 Hyperlipidemia, unspecified: Secondary | ICD-10-CM

## 2021-06-12 ENCOUNTER — Other Ambulatory Visit: Payer: Self-pay | Admitting: Family Medicine

## 2021-06-12 DIAGNOSIS — E785 Hyperlipidemia, unspecified: Secondary | ICD-10-CM

## 2021-10-13 ENCOUNTER — Encounter: Payer: Self-pay | Admitting: Adult Health

## 2021-10-13 ENCOUNTER — Telehealth (INDEPENDENT_AMBULATORY_CARE_PROVIDER_SITE_OTHER): Payer: Medicare Other | Admitting: Adult Health

## 2021-10-13 VITALS — Ht 69.02 in | Wt 180.0 lb

## 2021-10-13 DIAGNOSIS — R051 Acute cough: Secondary | ICD-10-CM | POA: Diagnosis not present

## 2021-10-13 DIAGNOSIS — Z8709 Personal history of other diseases of the respiratory system: Secondary | ICD-10-CM

## 2021-10-13 DIAGNOSIS — J019 Acute sinusitis, unspecified: Secondary | ICD-10-CM

## 2021-10-13 MED ORDER — PREDNISONE 10 MG PO TABS: 30.0000 mg | ORAL_TABLET | Freq: Every day | ORAL | 0 refills | Status: DC

## 2021-10-13 MED ORDER — DOXYCYCLINE HYCLATE 100 MG PO TABS: 100.0000 mg | ORAL_TABLET | Freq: Two times a day (BID) | ORAL | 0 refills | Status: DC

## 2021-10-13 MED ORDER — HYDROCOD POLST-CPM POLST ER 10-8 MG/5ML PO SUER: 5.0000 mL | Freq: Every evening | ORAL | 0 refills | Status: DC | PRN

## 2021-10-13 NOTE — Progress Notes (Signed)
Virtual Visit via Telephone Note  I connected with Jonathan Ingram on 10/13/21 at  8:30 AM EST by telephone and verified that I am speaking with the correct person using two identifiers.  Location: Patient: at home  Provider: Provider: Provider's office at  [facility], [place].      I discussed the limitations, risks, security and privacy concerns of performing an evaluation and management service by telephone and the availability of in person appointments. I also discussed with the patient that there may be a patient responsible charge related to this service. The patient expressed understanding and agreed to proceed.   History of Present Illness:  Patient reports he has had cough and pressure in his face. Cough is keeping him from sleeping. Chest congestion, nasal congestion , pressure in nose and forehead. Productive clear congestion is thick.  Onset 14 days ago. Reports at home covid test was negative. Denies any  problems breathing. Denies  any edema.  Cough keeping him up at night.  Denies any fever.   Patient  denies any fever, body aches,chills, rash, chest pain, shortness of breath, nausea, vomiting, or diarrhea.    has Thrombocytopenia ([place]); Anxiety; Insomnia; Low testosterone; Hyperlipidemia; Nicotine dependence; Encounter for general adult medical examination with abnormal findings; Osteoarthritis; Elevated blood pressure reading; Elevated TSH; Hemangioma of skin; Allergic rhinitis; Colon cancer screening; and Inflamed sebaceous cyst on their problem list.  Observations/Objective:  Patient is alert and oriented and responsive to questions Engages in conversation with provider. Speaks in full sentences without any pauses without any shortness of breath or distress.    Assessment and Plan:  Acute non-recurrent sinusitis, unspecified location - Plan: chlorpheniramine-HYDROcodone (TUSSIONEX PENNKINETIC ER) 10-8 MG/5ML SUER, doxycycline  (VIBRA-TABS) 100 MG tablet, predniSONE (DELTASONE) 10 MG tablet  Acute cough - Plan: chlorpheniramine-HYDROcodone (TUSSIONEX PENNKINETIC ER) 10-8 MG/5ML SUER, predniSONE (DELTASONE) 10 MG tablet  History of URI (upper respiratory infection)   Meds ordered this encounter  Medications   chlorpheniramine-HYDROcodone (TUSSIONEX PENNKINETIC ER) 10-8 MG/5ML SUER    Sig: Take 5 mLs by mouth at bedtime as needed for cough.    Dispense:  150 mL    Refill:  0    rx written   doxycycline (VIBRA-TABS) 100 MG tablet    Sig: Take 1 tablet (100 mg total) by mouth 2 (two) times daily. Not to take with any other sleep medication or sedative.    Dispense:  20 tablet    Refill:  0   predniSONE (DELTASONE) 10 MG tablet    Sig: Take 3 tablets (30 mg total) by mouth daily with breakfast.    Dispense:  9 tablet    Refill:  0    Follow Up Instructions: Red Flags discussed. The patient was given clear instructions to go to ER or return to medical center if any red flags develop, symptoms do not improve, worsen or new problems develop. They verbalized understanding.    I discussed the assessment and treatment plan with the patient. The patient was provided an opportunity to ask questions and all were answered. The patient agreed with the plan and demonstrated an understanding of the instructions.   The patient was advised to call back or seek an in-person evaluation if the symptoms worsen or if the condition fails to improve as anticipated.  I provided 20 minutes of non-face-to-face time during this encounter.   Marcille Buffy, FNP

## 2021-11-01 ENCOUNTER — Other Ambulatory Visit: Payer: Self-pay

## 2021-11-01 ENCOUNTER — Ambulatory Visit (INDEPENDENT_AMBULATORY_CARE_PROVIDER_SITE_OTHER): Payer: Medicare Other | Admitting: Family Medicine

## 2021-11-01 ENCOUNTER — Encounter: Payer: Self-pay | Admitting: Family Medicine

## 2021-11-01 VITALS — BP 110/70 | HR 56 | Temp 98.3°F | Ht 69.0 in | Wt 184.2 lb

## 2021-11-01 DIAGNOSIS — E785 Hyperlipidemia, unspecified: Secondary | ICD-10-CM | POA: Diagnosis not present

## 2021-11-01 DIAGNOSIS — Z125 Encounter for screening for malignant neoplasm of prostate: Secondary | ICD-10-CM

## 2021-11-01 DIAGNOSIS — Z23 Encounter for immunization: Secondary | ICD-10-CM | POA: Diagnosis not present

## 2021-11-01 DIAGNOSIS — F17210 Nicotine dependence, cigarettes, uncomplicated: Secondary | ICD-10-CM

## 2021-11-01 DIAGNOSIS — Z1211 Encounter for screening for malignant neoplasm of colon: Secondary | ICD-10-CM

## 2021-11-01 DIAGNOSIS — Z0000 Encounter for general adult medical examination without abnormal findings: Secondary | ICD-10-CM | POA: Diagnosis not present

## 2021-11-01 DIAGNOSIS — D696 Thrombocytopenia, unspecified: Secondary | ICD-10-CM | POA: Diagnosis not present

## 2021-11-01 DIAGNOSIS — R7989 Other specified abnormal findings of blood chemistry: Secondary | ICD-10-CM

## 2021-11-01 DIAGNOSIS — E663 Overweight: Secondary | ICD-10-CM

## 2021-11-01 LAB — CBC
HCT: 45.3 % (ref 39.0–52.0)
Hemoglobin: 15.5 g/dL (ref 13.0–17.0)
MCHC: 34.3 g/dL (ref 30.0–36.0)
MCV: 95.8 fl (ref 78.0–100.0)
Platelets: 155 10*3/uL (ref 150.0–400.0)
RBC: 4.73 Mil/uL (ref 4.22–5.81)
RDW: 13.5 % (ref 11.5–15.5)
WBC: 4.7 10*3/uL (ref 4.0–10.5)

## 2021-11-01 LAB — COMPREHENSIVE METABOLIC PANEL
ALT: 33 U/L (ref 0–53)
AST: 20 U/L (ref 0–37)
Albumin: 4.5 g/dL (ref 3.5–5.2)
Alkaline Phosphatase: 61 U/L (ref 39–117)
BUN: 12 mg/dL (ref 6–23)
CO2: 30 mEq/L (ref 19–32)
Calcium: 9.4 mg/dL (ref 8.4–10.5)
Chloride: 102 mEq/L (ref 96–112)
Creatinine, Ser: 0.96 mg/dL (ref 0.40–1.50)
GFR: 82.46 mL/min (ref >60.00)
Glucose, Bld: 103 mg/dL — ABNORMAL HIGH (ref 70–99)
Potassium: 4.4 mEq/L (ref 3.5–5.1)
Sodium: 138 mEq/L (ref 135–145)
Total Bilirubin: 0.8 mg/dL (ref 0.2–1.2)
Total Protein: 6.5 g/dL (ref 6.0–8.3)

## 2021-11-01 LAB — LIPID PANEL
Cholesterol: 128 mg/dL (ref 0–200)
HDL: 42.8 mg/dL (ref >39.00)
LDL Cholesterol: 51 mg/dL (ref 0–99)
NonHDL: 85.27
Total CHOL/HDL Ratio: 3
Triglycerides: 171 mg/dL — ABNORMAL HIGH (ref 0.0–149.0)
VLDL: 34.2 mg/dL (ref 0.0–40.0)

## 2021-11-01 LAB — TSH: TSH: 5.14 u[IU]/mL (ref 0.35–5.50)

## 2021-11-01 LAB — HEMOGLOBIN A1C: Hgb A1c MFr Bld: 5.5 % (ref 4.6–6.5)

## 2021-11-01 LAB — PSA, MEDICARE: PSA: 1.48 ng/ml (ref 0.10–4.00)

## 2021-11-01 NOTE — Patient Instructions (Signed)
Nice to see you. Please quit smoking. GI should contact you to schedule colonoscopy. Somebody from [facility] will contact you to set up lung cancer screening. We will get lab work today and contact you with results.

## 2021-11-01 NOTE — Progress Notes (Signed)
Jonathan Rumps, MD Phone: [phone]  Jonathan Ingram is a 65 y.o. male who presents today for CPE.  Diet: lots of chicken, plenty of fruits and vegetables, no soda or sweet tea Exercise: walks lots for exercise Colonoscopy: due Prostate cancer screening: due Family history-  Prostate cancer: no  Colon cancer: no Vaccines-   Flu: UTD  Tetanus: UTD  Shingles: due  COVID19: x3  Pneumonia: due Hep C Screening: UTD Tobacco use: Patient is smoking 5 to 6 cigarettes a day.  He has smoked since age 53.  He has quit for 6 and 4 years in the past.  Over the last 10 years he has smoked half a pack per day.  Previously he had smoked 1 pack/day. Alcohol use: minimal Illicit Drug use: no Dentist: yes Ophthalmology: yes The patient lost his wife last year to a heart attack.  He is raising his 3-year-old granddaughter that does provide some stress.   Active Ambulatory Problems    Diagnosis Date Noted   Thrombocytopenia ([place]) 01/06/2016   Anxiety 04/10/2016   Insomnia 04/10/2016   Low testosterone 04/10/2016   Hyperlipidemia 04/10/2016   Nicotine dependence, cigarettes, uncomplicated 09/81/1914   Routine general medical examination at a health care facility 04/11/2016   Osteoarthritis 01/30/2018   Elevated blood pressure reading 01/30/2018   Elevated TSH 01/30/2018   Hemangioma of skin 07/08/2018   Allergic rhinitis 01/07/2019   Colon cancer screening 04/15/2019   Inflamed sebaceous cyst 08/08/2019   Resolved Ambulatory Problems    Diagnosis Date Noted   Splenomegaly 01/06/2016   Otitis media 10/14/2018   Past Medical History:  Diagnosis Date   Allergy    Chicken pox    Depression     Family History  Problem Relation Age of Onset   Hypertension Mother    Bladder Cancer Mother    Clotting disorder Mother    Clotting disorder Brother    Hypertension Maternal Uncle    Bladder Cancer Maternal Uncle    Leukemia Paternal Uncle    Liver cancer Maternal Grandmother      Social History   Socioeconomic History   Marital status: Married    Spouse name: Not on file   Number of children: Not on file   Years of education: Not on file   Highest education level: Not on file  Occupational History   Not on file  Tobacco Use   Smoking status: Some Days    Packs/day: 0.50    Years: 42.00    Pack years: 21.00    Types: Cigarettes   Smokeless tobacco: Never  Substance and Sexual Activity   Alcohol use: Yes    Alcohol/week: 1.0 standard drink    Types: 1 Standard drinks or equivalent per week    Comment: 4-6 beers on the weekends   Drug use: No   Sexual activity: Not on file  Other Topics Concern   Not on file  Social History Narrative   Not on file   Social Determinants of Health   Financial Resource Strain: Not on file  Food Insecurity: Not on file  Transportation Needs: Not on file  Physical Activity: Not on file  Stress: Not on file  Social Connections: Not on file  Intimate Partner Violence: Not on file    ROS  General:  Negative for nexplained weight loss, fever Skin: Negative for new or changing mole, sore that won't heal HEENT: Negative for trouble hearing, trouble seeing, ringing in ears, mouth sores, hoarseness, change in voice,  dysphagia. CV:  Negative for chest pain, dyspnea, edema, palpitations Resp: Negative for cough, dyspnea, hemoptysis GI: Negative for nausea, vomiting, diarrhea, constipation, abdominal pain, melena, hematochezia. GU: Negative for dysuria, incontinence, urinary hesitance, hematuria, vaginal or penile discharge, polyuria, sexual difficulty, lumps in testicle or breasts MSK: Negative for muscle cramps or aches, joint pain or swelling Neuro: Negative for headaches, weakness, numbness, dizziness, passing out/fainting Psych: Negative for depression, anxiety, memory problems  Objective  Physical Exam Vitals:   11/01/21 1024  BP: 110/70  Pulse: (!) 56  Temp: 98.3 F (36.8 C)  SpO2: 99%    BP Readings  from Last 3 Encounters:  11/01/21 110/70  08/19/19 133/80  08/12/19 (!) 146/84   Wt Readings from Last 3 Encounters:  11/01/21 184 lb 3.2 oz (83.6 kg)  10/13/21 180 lb (81.6 kg)  08/19/19 188 lb (85.3 kg)    Physical Exam Constitutional:      General: He is not in acute distress.    Appearance: He is not diaphoretic.  HENT:     Head: Normocephalic and atraumatic.  Cardiovascular:     Rate and Rhythm: Normal rate and regular rhythm.     Heart sounds: Normal heart sounds.  Pulmonary:     Effort: Pulmonary effort is normal.     Breath sounds: Normal breath sounds.  Abdominal:     General: Bowel sounds are normal. There is no distension.     Palpations: Abdomen is soft.     Tenderness: There is no abdominal tenderness. There is no guarding or rebound.  Musculoskeletal:     Right lower leg: No edema.     Left lower leg: No edema.  Lymphadenopathy:     Cervical: No cervical adenopathy.  Skin:    General: Skin is warm and dry.  Neurological:     Mental Status: He is alert.     Assessment/Plan:   Problem List Items Addressed This Visit     Colon cancer screening   Relevant Orders   Ambulatory referral to Gastroenterology   Elevated TSH   Relevant Orders   TSH   Hyperlipidemia   Relevant Orders   Comp Met (CMET)   Lipid panel   Nicotine dependence, cigarettes, uncomplicated   Relevant Orders   Ambulatory Referral Lung Cancer Screening [facility]   Routine general medical examination at a health care facility - Primary    Physical exam completed today.  The patient was encouraged to continue with healthy diet and exercise.  Prostate cancer screening with PSA today.  He will be referred to GI for colon cancer screening.  He was encouraged to get the updated COVID booster.  He was encouraged to get the Shingrix vaccine.  Prevnar 20 was given today.  He was encouraged to quit smoking.  He will be referred for lung cancer screening given his smoking history.  Lab  work as outlined.  I did offer support regarding the stress he has undergone over the last year and a half.      Thrombocytopenia ([place])   Relevant Orders   CBC   Other Visit Diagnoses     Need for pneumococcal vaccination       Relevant Orders   Pneumococcal conjugate vaccine 20-valent (Prevnar 20) (Completed)   Overweight       Relevant Orders   HgB A1c   Prostate cancer screening       Relevant Orders   PSA, Medicare ( [place] only)       Return  in about 1 year (around 11/01/2022) for CPE.  This visit occurred during the SARS-CoV-2 public health emergency.  Safety protocols were in place, including screening questions prior to the visit, additional usage of staff PPE, and extensive cleaning of exam room while observing appropriate contact time as indicated for disinfecting solutions.    Jonathan Rumps, MD [place]

## 2021-11-01 NOTE — Assessment & Plan Note (Signed)
Physical exam completed today.  The patient was encouraged to continue with healthy diet and exercise.  Prostate cancer screening with PSA today.  He will be referred to GI for colon cancer screening.  He was encouraged to get the updated COVID booster.  He was encouraged to get the Shingrix vaccine.  Prevnar 20 was given today.  He was encouraged to quit smoking.  He will be referred for lung cancer screening given his smoking history.  Lab work as outlined.  I did offer support regarding the stress he has undergone over the last year and a half.

## 2021-11-02 ENCOUNTER — Telehealth: Payer: Self-pay | Admitting: Family Medicine

## 2021-11-02 NOTE — Telephone Encounter (Signed)
Patient called and said he does not understand his lab results on his MyChart. When Provider releases labs please call him.

## 2021-11-03 ENCOUNTER — Other Ambulatory Visit: Payer: Self-pay

## 2021-11-03 DIAGNOSIS — Z1211 Encounter for screening for malignant neoplasm of colon: Secondary | ICD-10-CM

## 2021-11-03 MED ORDER — NA SULFATE-K SULFATE-MG SULF 17.5-3.13-1.6 GM/177ML PO SOLN: 1.0000 | Freq: Once | ORAL | 0 refills | Status: AC

## 2021-11-03 NOTE — Progress Notes (Signed)
Gastroenterology Pre-Procedure Review  Request Date: 12/02/2021 Requesting Physician: Dr. Allen Norris  PATIENT REVIEW QUESTIONS: The patient responded to the following health history questions as indicated:    1. Are you having any GI issues? no 2. Do you have a personal history of Polyps? no 3. Do you have a family history of Colon Cancer or Polyps? no 4. Diabetes Mellitus? no 5. Joint replacements in the past 12 months?no 6. Major health problems in the past 3 months?no 7. Any artificial heart valves, MVP, or defibrillator?no    MEDICATIONS & ALLERGIES:    Patient reports the following regarding taking any anticoagulation/antiplatelet therapy:   Plavix, Coumadin, Eliquis, Xarelto, Lovenox, Pradaxa, Brilinta, or Effient? no Aspirin? no  Patient confirms/reports the following medications:  Current Outpatient Medications  Medication Sig Dispense Refill   meloxicam (MOBIC) 15 MG tablet take 1 tablet by mouth once daily 30 tablet 0   rosuvastatin (CRESTOR) 40 MG tablet TAKE 1 TABLET(40 MG) BY MOUTH DAILY 90 tablet 3   triamcinolone (NASACORT) 55 MCG/ACT AERO nasal inhaler Place 2 sprays into the nose daily. 1 Inhaler 0   No current facility-administered medications for this visit.    Patient confirms/reports the following allergies:  Allergies  Allergen Reactions   Penicillins Other (See Comments)    Allergic as child, not sure of reaction    No orders of the defined types were placed in this encounter.   AUTHORIZATION INFORMATION Primary Insurance: 1D#: Group #:  Secondary Insurance: 1D#: Group #:  SCHEDULE INFORMATION: Date: 12/02/2021 Time: Location: [place]

## 2021-11-07 NOTE — Telephone Encounter (Signed)
I called and spoke with the patient and informed him that his labs were all good and he understood.  Oluwaferanmi Wain,cma

## 2021-11-16 ENCOUNTER — Encounter: Payer: Self-pay | Admitting: Gastroenterology

## 2021-12-02 ENCOUNTER — Ambulatory Visit: Payer: Medicare Other | Admitting: Anesthesiology

## 2021-12-02 ENCOUNTER — Encounter: Payer: Self-pay | Admitting: Gastroenterology

## 2021-12-02 ENCOUNTER — Other Ambulatory Visit: Payer: Self-pay

## 2021-12-02 ENCOUNTER — Ambulatory Visit
Admission: RE | Admit: 2021-12-02 | Discharge: 2021-12-02 | Disposition: A | Discharge status: Home / Self Care | Payer: Medicare Other | Attending: Gastroenterology | Admitting: Gastroenterology

## 2021-12-02 ENCOUNTER — Encounter
Admission: RE | Disposition: A | Discharge status: Home / Self Care | Payer: Self-pay | Source: Home / Self Care | Attending: Gastroenterology

## 2021-12-02 DIAGNOSIS — Z1211 Encounter for screening for malignant neoplasm of colon: Secondary | ICD-10-CM | POA: Insufficient documentation

## 2021-12-02 DIAGNOSIS — K648 Other hemorrhoids: Secondary | ICD-10-CM | POA: Insufficient documentation

## 2021-12-02 DIAGNOSIS — K635 Polyp of colon: Secondary | ICD-10-CM

## 2021-12-02 DIAGNOSIS — D124 Benign neoplasm of descending colon: Secondary | ICD-10-CM | POA: Insufficient documentation

## 2021-12-02 DIAGNOSIS — D125 Benign neoplasm of sigmoid colon: Secondary | ICD-10-CM | POA: Diagnosis not present

## 2021-12-02 DIAGNOSIS — F17200 Nicotine dependence, unspecified, uncomplicated: Secondary | ICD-10-CM | POA: Insufficient documentation

## 2021-12-02 HISTORY — PX: POLYPECTOMY: SHX5525

## 2021-12-02 HISTORY — DX: Presence of dental prosthetic device (complete) (partial): Z97.2

## 2021-12-02 HISTORY — PX: COLONOSCOPY WITH PROPOFOL: SHX5780

## 2021-12-02 SURGERY — COLONOSCOPY WITH PROPOFOL
Anesthesia: General | Site: Rectum

## 2021-12-02 MED ORDER — LIDOCAINE HCL (CARDIAC) PF 100 MG/5ML IV SOSY
PREFILLED_SYRINGE | INTRAVENOUS | Status: DC | PRN
  Administered 2021-12-02: 30 mg via INTRAVENOUS

## 2021-12-02 MED ORDER — PROPOFOL 10 MG/ML IV BOLUS
INTRAVENOUS | Status: DC | PRN
  Administered 2021-12-02: 40 mg via INTRAVENOUS
  Administered 2021-12-02: 30 mg via INTRAVENOUS
  Administered 2021-12-02 (×3): 40 mg via INTRAVENOUS
  Administered 2021-12-02: 30 mg via INTRAVENOUS
  Administered 2021-12-02: 150 mg via INTRAVENOUS

## 2021-12-02 MED ORDER — LACTATED RINGERS IV SOLN: INTRAVENOUS | Status: DC

## 2021-12-02 MED ORDER — SODIUM CHLORIDE 0.9 % IV SOLN: INTRAVENOUS | Status: DC

## 2021-12-02 MED ORDER — STERILE WATER FOR IRRIGATION IR SOLN
Status: DC | PRN
  Administered 2021-12-02: 1

## 2021-12-02 MED ORDER — ACETAMINOPHEN 160 MG/5ML PO SOLN: 325.0000 mg | ORAL | Status: DC | PRN

## 2021-12-02 MED ORDER — ACETAMINOPHEN 325 MG PO TABS: 325.0000 mg | ORAL_TABLET | ORAL | Status: DC | PRN

## 2021-12-02 SURGICAL SUPPLY — 22 items

## 2021-12-02 NOTE — Op Note (Signed)
[facility] Gastroenterology Patient Name: Jonathan Ingram Procedure Date: 12/02/2021 8:55 AM MRN: [id number] Account #: [id number] Date of Birth: September 19, 1956 Admit Type: Outpatient Age: 66 Room: [facility] OR ROOM 01 Gender: Male Note Status: Finalized Instrument Name: [id number] Procedure:             Colonoscopy Indications:           Screening for colorectal malignant neoplasm Providers:             [name] MD, MD Referring MD:          Angela Adam. Caryl Bis (Referring MD) Medicines:             Propofol per Anesthesia Complications:         No immediate complications. Procedure:             Pre-Anesthesia Assessment:                        - Prior to the procedure, a History and Physical was                         performed, and patient medications and allergies were                         reviewed. The patient's tolerance of previous                         anesthesia was also reviewed. The risks and benefits                         of the procedure and the sedation options and risks                         were discussed with the patient. All questions were                         answered, and informed consent was obtained. Prior                         Anticoagulants: The patient has taken no previous                         anticoagulant or antiplatelet agents. ASA Grade                         Assessment: II - A patient with mild systemic disease.                         After reviewing the risks and benefits, the patient                         was deemed in satisfactory condition to undergo the                         procedure.                        After obtaining informed consent, the colonoscope was  passed under direct vision. Throughout the procedure,                         the patient's blood pressure, pulse, and oxygen                         saturations were monitored continuously. The                         Colonoscope was  introduced through the anus and                         advanced to the the cecum, identified by appendiceal                         orifice and ileocecal valve. The colonoscopy was                         performed without difficulty. The patient tolerated                         the procedure well. The quality of the bowel                         preparation was excellent. Findings:      The perianal and digital rectal examinations were normal.      Three sessile polyps were found in the descending colon. The polyps were       3 to 5 mm in size. These polyps were removed with a cold snare.       Resection and retrieval were complete.      Six sessile polyps were found in the sigmoid colon. The polyps were 2 to       5 mm in size. These polyps were removed with a cold snare. Resection and       retrieval were complete.      Non-bleeding internal hemorrhoids were found during retroflexion. The       hemorrhoids were Grade I (internal hemorrhoids that do not prolapse). Impression:            - Three 3 to 5 mm polyps in the descending colon,                         removed with a cold snare. Resected and retrieved.                        - Six 2 to 5 mm polyps in the sigmoid colon, removed                         with a cold snare. Resected and retrieved.                        - Non-bleeding internal hemorrhoids. Recommendation:        - Discharge patient to home.                        - Resume previous diet.                        -  Continue present medications.                        - Await pathology results.                        - If the pathology report reveals adenomatous tissue,                         then repeat the colonoscopy for surveillance in 3                         years. Procedure Code(s):     --- Professional ---                        [phone], Colonoscopy, flexible; with removal of                         tumor(s), polyp(s), or other lesion(s) by snare                          technique Diagnosis Code(s):     --- Professional ---                        Z12.11, Encounter for screening for malignant neoplasm                         of colon                        K63.5, Polyp of colon CPT copyright 2019 American Medical Association. All rights reserved. The codes documented in this report are preliminary and upon coder review may  be revised to meet current compliance requirements. [name] MD, MD 12/02/2021 9:25:36 AM This report has been signed electronically. Number of Addenda: 0 Note Initiated On: 12/02/2021 8:55 AM Scope Withdrawal Time: 0 hours 9 minutes 48 seconds  Total Procedure Duration: 0 hours 14 minutes 23 seconds  Estimated Blood Loss:  Estimated blood loss: none.      [facility]

## 2021-12-02 NOTE — Transfer of Care (Signed)
Immediate Anesthesia Transfer of Care Note  Patient: Jonathan Ingram  Procedure(s) Performed: COLONOSCOPY WITH PROPOFOL (Rectum) POLYPECTOMY (Rectum)  Patient Location: PACU  Anesthesia Type: General  Level of Consciousness: awake, alert  and patient cooperative  Airway and Oxygen Therapy: Patient Spontanous Breathing and Patient connected to supplemental oxygen  Post-op Assessment: Post-op Vital signs reviewed, Patient's Cardiovascular Status Stable, Respiratory Function Stable, Patent Airway and No signs of Nausea or vomiting  Post-op Vital Signs: Reviewed and stable  Complications: No notable events documented.

## 2021-12-02 NOTE — Anesthesia Procedure Notes (Signed)
Date/Time: 12/02/2021 9:04 AM Performed by: Cameron Ali, CRNA Pre-anesthesia Checklist: Patient identified, Emergency Drugs available, Suction available, Timeout performed and Patient being monitored Patient Re-evaluated:Patient Re-evaluated prior to induction Oxygen Delivery Method: Nasal cannula Placement Confirmation: positive ETCO2

## 2021-12-02 NOTE — Anesthesia Postprocedure Evaluation (Signed)
Anesthesia Post Note  Patient: Jonathan Ingram  Procedure(s) Performed: COLONOSCOPY WITH PROPOFOL (Rectum) POLYPECTOMY (Rectum)     Patient location during evaluation: PACU Anesthesia Type: General Level of consciousness: awake and alert Pain management: pain level controlled Vital Signs Assessment: post-procedure vital signs reviewed and stable Respiratory status: spontaneous breathing, nonlabored ventilation, respiratory function stable and patient connected to nasal cannula oxygen Cardiovascular status: blood pressure returned to baseline and stable Postop Assessment: no apparent nausea or vomiting Anesthetic complications: no   No notable events documented.  Trecia Rogers

## 2021-12-02 NOTE — H&P (Signed)
[name], MD [facility] [address] [place], [place] [address] Phone: [phone] Fax : [phone]  Primary Care Physician:  Leone Haven, MD Primary Gastroenterologist:  Dr. Allen Norris  Pre-Procedure History & Physical: HPI:  Jonathan Ingram is a 66 y.o. male is here for a screening colonoscopy.   Past Medical History:  Diagnosis Date   Allergy    Anxiety    Chicken pox    Depression    Hyperlipidemia    Thrombocytopenia ([facility])    Wears dentures    full lower.  Doesn't wear often.    Past Surgical History:  Procedure Laterality Date   NO PAST SURGERIES      Prior to Admission medications   Medication Sig Start Date End Date Taking? Authorizing Provider  fexofenadine (ALLEGRA) 180 MG tablet Take 180 mg by mouth daily.   Yes [provider]  rosuvastatin (CRESTOR) 40 MG tablet TAKE 1 TABLET(40 MG) BY MOUTH DAILY 06/13/21  Yes Leone Haven, MD    Allergies as of 11/03/2021 - Review Complete 11/01/2021  Allergen Reaction Noted   Penicillins Other (See Comments) 10/27/2015    Family History  Problem Relation Age of Onset   Hypertension Mother    Bladder Cancer Mother    Clotting disorder Mother    Clotting disorder Brother    Hypertension Maternal Uncle    Bladder Cancer Maternal Uncle    Leukemia Paternal Uncle    Liver cancer Maternal Grandmother     Social History   Socioeconomic History   Marital status: Widowed    Spouse name: Not on file   Number of children: Not on file   Years of education: Not on file   Highest education level: Not on file  Occupational History   Not on file  Tobacco Use   Smoking status: Some Days    Packs/day: 0.50    Years: 42.00    Pack years: 21.00    Types: Cigarettes   Smokeless tobacco: Never  Vaping Use   Vaping Use: Never used  Substance and Sexual Activity   Alcohol use: Yes    Alcohol/week: 1.0 standard drink    Types: 1 Standard drinks or equivalent per week    Comment: 4-6 beers  on the weekends   Drug use: No   Sexual activity: Not on file  Other Topics Concern   Not on file  Social History Narrative   Not on file   Social Determinants of Health   Financial Resource Strain: Not on file  Food Insecurity: Not on file  Transportation Needs: Not on file  Physical Activity: Not on file  Stress: Not on file  Social Connections: Not on file  Intimate Partner Violence: Not on file    Review of Systems: See HPI, otherwise negative ROS  Physical Exam: BP 132/84    Pulse (!) 57    Temp 97.7 F (36.5 C) (Temporal)    Resp 14    Ht 5\' 9"  (1.753 m)    Wt 79.8 kg    SpO2 100%    BMI 25.99 kg/m  General:   Alert,  pleasant and cooperative in NAD Head:  Normocephalic and atraumatic. Neck:  Supple; no masses or thyromegaly. Lungs:  Clear throughout to auscultation.    Heart:  Regular rate and rhythm. Abdomen:  Soft, nontender and nondistended. Normal bowel sounds, without guarding, and without rebound.   Neurologic:  Alert and  oriented x4;  grossly normal neurologically.  Impression/Plan: [place]  [name] is now here to undergo a screening colonoscopy.  Risks, benefits, and alternatives regarding colonoscopy have been reviewed with the patient.  Questions have been answered.  All parties agreeable.

## 2021-12-02 NOTE — Anesthesia Preprocedure Evaluation (Signed)
Anesthesia Evaluation  Patient identified by MRN, date of birth, ID band Patient awake    Reviewed: Allergy & Precautions, H&P , NPO status , Patient's Chart, lab work & pertinent test results, reviewed documented beta blocker date and time   Airway Mallampati: II  TM Distance: >3 FB Neck ROM: full    Dental  (+) Missing   Pulmonary Current Smoker and Patient abstained from smoking.,    Pulmonary exam normal breath sounds clear to auscultation       Cardiovascular Exercise Tolerance: Good negative cardio ROS Normal cardiovascular exam Rhythm:regular Rate:Normal     Neuro/Psych PSYCHIATRIC DISORDERS Anxiety Depression negative neurological ROS     GI/Hepatic negative GI ROS, Neg liver ROS,   Endo/Other  negative endocrine ROS  Renal/GU negative Renal ROS  negative genitourinary   Musculoskeletal   Abdominal   Peds  Hematology Hx of thrombocytopenia   Anesthesia Other Findings   Reproductive/Obstetrics negative OB ROS                             Anesthesia Physical Anesthesia Plan  ASA: 2  Anesthesia Plan: General   Post-op Pain Management:    Induction:   PONV Risk Score and Plan:   Airway Management Planned:   Additional Equipment:   Intra-op Plan:   Post-operative Plan:   Informed Consent: I have reviewed the patients History and Physical, chart, labs and discussed the procedure including the risks, benefits and alternatives for the proposed anesthesia with the patient or authorized representative who has indicated his/her understanding and acceptance.     Dental Advisory Given  Plan Discussed with: CRNA and Anesthesiologist  Anesthesia Plan Comments:         Anesthesia Quick Evaluation

## 2021-12-05 ENCOUNTER — Encounter: Payer: Self-pay | Admitting: Gastroenterology

## 2021-12-06 ENCOUNTER — Encounter: Payer: Self-pay | Admitting: Gastroenterology

## 2021-12-06 LAB — SURGICAL PATHOLOGY

## 2022-03-21 ENCOUNTER — Other Ambulatory Visit: Payer: Self-pay | Admitting: Family Medicine

## 2022-03-21 DIAGNOSIS — E785 Hyperlipidemia, unspecified: Secondary | ICD-10-CM

## 2022-04-26 ENCOUNTER — Ambulatory Visit (INDEPENDENT_AMBULATORY_CARE_PROVIDER_SITE_OTHER): Payer: Medicare Other

## 2022-04-26 VITALS — Ht 69.0 in | Wt 176.0 lb

## 2022-04-26 DIAGNOSIS — Z0000 Encounter for general adult medical examination without abnormal findings: Secondary | ICD-10-CM

## 2022-04-26 NOTE — Patient Instructions (Addendum)
  Mr. Jonathan Ingram , Thank you for taking time to come for your Medicare Wellness Visit. I appreciate your ongoing commitment to your health goals. Please review the following plan we discussed and let me know if I can assist you in the future.   These are the goals we discussed:  Goals      Maintain Healthy Lifestyle     Stay active Healthy diet        This is a list of the screening recommended for you and due dates:  Health Maintenance  Topic Date Due   COVID-19 Vaccine (4 - Booster for Moderna series) 05/12/2022*   Zoster (Shingles) Vaccine (1 of 2) 07/27/2022*   Flu Shot  06/27/2022   Colon Cancer Screening  12/02/2024   Tetanus Vaccine  04/10/2026   Pneumonia Vaccine  Completed   Hepatitis C Screening: USPSTF Recommendation to screen - Ages 18-79 yo.  Completed   HPV Vaccine  Aged Out  *Topic was postponed. The date shown is not the original due date.

## 2022-04-26 NOTE — Progress Notes (Signed)
Subjective:   Jonathan Ingram is a 66 y.o. male who presents for an Initial Medicare Annual Wellness Visit.  Review of Systems    No ROS.  Medicare Wellness Virtual Visit.  Visual/audio telehealth visit, UTA vital signs.   See social history for additional risk factors.   Cardiac Risk Factors include: advanced age (>53mn, >>58women);male gender     Objective:    Today's Vitals   04/26/22 1300  Weight: 176 lb (79.8 kg)  Height: '5\' 9"'$  (1.753 m)   Body mass index is 25.99 kg/m.     04/26/2022    1:06 PM 01/26/2016    9:21 AM 01/05/2016    9:22 AM  Advanced Directives  Does Patient Have a Medical Advance Directive? No No No  Would patient like information on creating a medical advance directive? No - Patient declined Yes - Educational materials given No - patient declined information    Current Medications (verified) Outpatient Encounter Medications as of 04/26/2022  Medication Sig   fexofenadine (ALLEGRA) 180 MG tablet Take 180 mg by mouth daily.   rosuvastatin (CRESTOR) 40 MG tablet TAKE 1 TABLET(40 MG) BY MOUTH DAILY   No facility-administered encounter medications on file as of 04/26/2022.    Allergies (verified) Penicillins   History: Past Medical History:  Diagnosis Date   Allergy    Anxiety    Chicken pox    Depression    Hyperlipidemia    Thrombocytopenia (HCC)    Wears dentures    full lower.  Doesn't wear often.   Past Surgical History:  Procedure Laterality Date   COLONOSCOPY WITH PROPOFOL N/A 12/02/2021   Procedure: COLONOSCOPY WITH PROPOFOL;  Surgeon: WLucilla Lame MD;  [facility]  Service: Endoscopy;  Laterality: N/A;  needs to be last   NO PAST SURGERIES     POLYPECTOMY N/A 12/02/2021   Procedure: POLYPECTOMY;  Surgeon: WLucilla Lame MD;  [facility]  Service: Endoscopy;  Laterality: N/A;   Family History  Problem Relation Age of Onset   Hypertension Mother    Bladder Cancer Mother    Clotting disorder  Mother    Clotting disorder Brother    Diabetes Daughter    Hypertension Maternal Uncle    Bladder Cancer Maternal Uncle    Leukemia Paternal Uncle    Liver cancer Maternal Grandmother    Social History   Socioeconomic History   Marital status: Widowed    Spouse name: Not on file   Number of children: Not on file   Years of education: Not on file   Highest education level: Not on file  Occupational History   Not on file  Tobacco Use   Smoking status: Some Days    Packs/day: 0.50    Years: 42.00    Pack years: 21.00    Types: Cigarettes   Smokeless tobacco: Never  Vaping Use   Vaping Use: Never used  Substance and Sexual Activity   Alcohol use: Yes    Alcohol/week: 1.0 standard drink    Types: 1 Standard drinks or equivalent per week    Comment: 4-6 beers on the weekends   Drug use: No   Sexual activity: Not on file  Other Topics Concern   Not on file  Social History Narrative   Not on file   Social Determinants of Health   Financial Resource Strain: Low Risk    Difficulty of Paying Living Expenses: Not hard at all  Food Insecurity: No Food Insecurity  Worried About Charity fundraiser in the Last Year: Never true   [place] in the Last Year: Never true  Transportation Needs: No Transportation Needs   Lack of Transportation (Medical): No   Lack of Transportation (Non-Medical): No  Physical Activity: Not on file  Stress: No Stress Concern Present   Feeling of Stress : Not at all  Social Connections: Unknown   Frequency of Communication with Friends and Family: More than three times a week   Frequency of Social Gatherings with Friends and Family: More than three times a week   Attends Religious Services: Not on Electrical engineer or Organizations: Not on file   Attends Archivist Meetings: Not on file   Marital Status: Not on file    Tobacco Counseling Ready to quit: Not Answered Counseling given: Not Answered   Clinical  Intake:  Pre-visit preparation completed: Yes        Diabetes: No  How often do you need to have someone help you when you read instructions, pamphlets, or other written materials from your doctor or pharmacy?: 1 - Never    Interpreter Needed?: No      Activities of Daily Living    04/26/2022    1:07 PM  In your present state of health, do you have any difficulty performing the following activities:  Hearing? 0  Vision? 0  Difficulty concentrating or making decisions? 0  Walking or climbing stairs? 0  Dressing or bathing? 0  Doing errands, shopping? 0  Preparing Food and eating ? N  Using the Toilet? N  In the past six months, have you accidently leaked urine? N  Do you have problems with loss of bowel control? N  Managing your Medications? N  Managing your Finances? N  Housekeeping or managing your Housekeeping? N    Patient Care Team: Leone Haven, MD as PCP - General (Family Medicine)  Indicate any recent Medical Services you may have received from other than [facility] providers in the past year (date may be approximate).     Assessment:   This is a routine wellness examination for Kari.  Virtual Visit via Telephone Note  I connected with  NITISH ROES on 04/26/22 at  1:00 PM EDT by telephone and verified that I am speaking with the correct person using two identifiers.  Persons participating in the virtual visit: patient/Nurse Health Advisor   I discussed the limitations of performing an evaluation and management service by telehealth. We continued and completed visit with audio only. Some vital signs may be absent or patient reported.   Hearing/Vision screen Hearing Screening - Comments:: Patient is able to hear conversational tones without difficulty.  No issues reported. Vision Screening - Comments:: Followed by [facility] Wears corrective lenses They have seen their ophthalmologist in the last 12 months.    Dietary issues and exercise  activities discussed: Current Exercise Habits: Home exercise routine, Type of exercise: walking, Time (Minutes): > 60, Frequency (Times/Week): 7, Weekly Exercise (Minutes/Week): 0, Intensity: Mild   Goals Addressed             This Visit's Progress    Maintain Healthy Lifestyle       Stay active Healthy diet       Depression Screen    04/26/2022    1:04 PM 08/08/2019    4:09 PM 10/14/2018    4:04 PM  PHQ 2/9 Scores  PHQ - 2 Score 0  0 0    Fall Risk    04/26/2022    1:07 PM 11/01/2021   10:25 AM 08/19/2019    2:59 PM 08/12/2019   10:34 AM 08/08/2019    4:08 PM  Fall Risk   Falls in the past year? 0 0 0 0 0  Number falls in past yr: 0 0   0  Injury with Fall? 0 0     Risk for fall due to :  No Fall Risks     Follow up Falls evaluation completed Falls evaluation completed   Falls evaluation completed    [place]: Home free of loose throw rugs in walkways, pet beds, electrical cords, etc? No  Adequate lighting in your home to reduce risk of falls? Yes   ASSISTIVE DEVICES UTILIZED TO PREVENT FALLS: Life alert? No  Use of a cane, walker or w/c? No   TIMED UP AND GO: Was the test performed? No .   Cognitive Function:  Patient is alert and oriented x3.       Immunizations Immunization History  Administered Date(s) Administered   Fluad Quad(high Dose 65+) 10/19/2021   Influenza,inj,Quad PF,6+ Mos 01/30/2018, 12/19/2018, 08/08/2019   Influenza,inj,quad, With Preservative 12/19/2018   Moderna Sars-Covid-2 Vaccination 01/31/2020, 02/28/2020, 10/14/2020   PNEUMOCOCCAL CONJUGATE-20 11/01/2021   Pneumococcal Polysaccharide-23 04/10/2016   Tdap 04/10/2016   Shingrix Completed?: No.    Education has been provided regarding the importance of this vaccine. Patient has been advised to call insurance company to determine out of pocket expense if they have not yet received this vaccine. Advised may also receive vaccine at local pharmacy or  Health Dept. Verbalized acceptance and understanding.  Screening Tests Health Maintenance  Topic Date Due   COVID-19 Vaccine (4 - Booster for Moderna series) 05/12/2022 (Originally 12/09/2020)   Zoster Vaccines- Shingrix (1 of 2) 07/27/2022 (Originally 06/27/2005)   INFLUENZA VACCINE  06/27/2022   COLONOSCOPY (Pts 45-55yr Insurance coverage will need to be confirmed)  12/02/2024   TETANUS/TDAP  04/10/2026   Pneumonia Vaccine 66 Years old  Completed   Hepatitis C Screening  Completed   HPV VACCINES  Aged Out   Health Maintenance There are no preventive care reminders to display for this patient.  Vision Screening: Recommended annual ophthalmology exams for early detection of glaucoma and other disorders of the eye.  Dental Screening: Recommended annual dental exams for proper oral hygiene  Community Resource Referral / Chronic Care Management: CRR required this visit?  No   CCM required this visit?  No      Plan:   Keep all routine maintenance appointments.   I have personally reviewed and noted the following in the patient's chart:   Medical and social history Use of alcohol, tobacco or illicit drugs  Current medications and supplements including opioid prescriptions. Patient is not currently taking opioid prescriptions. Functional ability and status Nutritional status Physical activity Advanced directives List of other physicians Hospitalizations, surgeries, and ER visits in previous 12 months Vitals Screenings to include cognitive, depression, and falls Referrals and appointments  In addition, I have reviewed and discussed with patient certain preventive protocols, quality metrics, and best practice recommendations. A written personalized care plan for preventive services as well as general preventive health recommendations were provided to patient.     OVarney Biles LPN   54/48/1856

## 2022-07-27 DIAGNOSIS — H353132 Nonexudative age-related macular degeneration, bilateral, intermediate dry stage: Secondary | ICD-10-CM | POA: Diagnosis not present

## 2022-07-27 DIAGNOSIS — H524 Presbyopia: Secondary | ICD-10-CM | POA: Diagnosis not present

## 2022-08-01 ENCOUNTER — Telehealth: Payer: Self-pay

## 2022-08-01 NOTE — Patient Outreach (Signed)
  Care Coordination   Initial Visit Note   08/01/2022 Name: KEVAUGHN EWING MRN: [id number] DOB: Jun 26, 1956  Dawson Bills Dollard is a 66 y.o. year old male who sees Caryl Bis, Angela Adam, MD for primary care. I spoke with  Francis Dowse by phone today.  What matters to the patients health and wellness today?  Patient states he is doing very well-no medical/health issues or concerns at present. He is only taking one med on regular daily basis. He is independent with ADLs/IADLs.     Goals Addressed               This Visit's Progress     COMPLETED: Care Coordination-no follow required (pt-stated)        Care Coordination Interventions: Advised patient to complete care gaps including immunizations-AWV done on 04/26/22 Assessed social determinant of health barriers          SDOH assessments and interventions completed:  Yes  SDOH Interventions Today    Flowsheet Row Most Recent Value  SDOH Interventions   Food Insecurity Interventions Intervention Not Indicated  Transportation Interventions Intervention Not Indicated        Care Coordination Interventions Activated:  Yes  Care Coordination Interventions:  Yes, provided info regarding care gaps  Follow up plan: No further intervention required.   Encounter Outcome:  Pt. Visit Completed   Enzo Montgomery, RN,BSN,CCM [place] Telephonic Care Management Coordinator Direct Phone: [phone] Toll Free: [phone] Fax: [phone]

## 2022-09-21 ENCOUNTER — Telehealth (INDEPENDENT_AMBULATORY_CARE_PROVIDER_SITE_OTHER): Payer: Medicare Other | Admitting: Family Medicine

## 2022-09-21 VITALS — Ht 70.0 in | Wt 183.0 lb

## 2022-09-21 DIAGNOSIS — R0981 Nasal congestion: Secondary | ICD-10-CM | POA: Diagnosis not present

## 2022-09-21 DIAGNOSIS — R519 Headache, unspecified: Secondary | ICD-10-CM

## 2022-09-21 DIAGNOSIS — R059 Cough, unspecified: Secondary | ICD-10-CM | POA: Diagnosis not present

## 2022-09-21 MED ORDER — DOXYCYCLINE HYCLATE 100 MG PO TABS: 100.0000 mg | ORAL_TABLET | Freq: Two times a day (BID) | ORAL | 0 refills | Status: DC

## 2022-09-21 MED ORDER — BENZONATATE 100 MG PO CAPS: ORAL_CAPSULE | ORAL | 0 refills | Status: DC

## 2022-09-21 NOTE — Patient Instructions (Signed)
-  I sent the medication(s) we discussed to your pharmacy: Meds ordered this encounter  Medications   doxycycline (VIBRA-TABS) 100 MG tablet    Sig: Take 1 tablet (100 mg total) by mouth 2 (two) times daily.    Dispense:  20 tablet    Refill:  0   benzonatate (TESSALON PERLES) 100 MG capsule    Sig: 1-2 capsules up to twice daily as needed for cough    Dispense:  30 capsule    Refill:  0     I hope you are feeling better soon!  Seek in person care promptly if your symptoms worsen, new concerns arise or you are not improving with treatment.  It was nice to meet you today. I help  out with telemedicine visits on Tuesdays and Thursdays and am happy to help if you need a virtual follow up visit on those days. Otherwise, if you have any concerns or questions following this visit please schedule a follow up visit with your Primary Care office or seek care at a local urgent care clinic to avoid delays in care. If you are having severe or life threatening symptoms please call 911 and/or go to the nearest emergency room.

## 2022-09-21 NOTE — Progress Notes (Signed)
Virtual Visit via Telephone Note  I connected with Jonathan Ingram on 09/21/22 at 10:00 AM EDT by telephone and verified that I am speaking with the correct person using two identifiers.   I discussed the limitations of performing an evaluation and management service by telephone and requested permission for a phone visit. The patient expressed understanding and agreed to proceed.  Location patient:  [place] Location provider: work or home office Participants present for the call: patient, provider Patient did not have a visit with me in the prior 7 days to address this/these issue(s).   History of Present Illness:  Acute telemedicine visit for sinus congestion: -feels he has a sinus infection as reports allergies are worse this time of the year and sometimes turns into a sinus infection -no with sinus congestion, now has developed some maxillary sinus discomfort, cough -covid test was negative x 1 -Denies: fever, NVD, CP, SOB -Has tried:musinex, robitussin -Pertinent past medical history: see below -Pertinent medication allergies:  Allergies  Allergen Reactions   Penicillins Other (See Comments)    Allergic as child, not sure of reaction  -COVID-19 vaccine status: Immunization History  Administered Date(s) Administered   Fluad Quad(high Dose 65+) 10/19/2021   Influenza,inj,Quad PF,6+ Mos 01/30/2018, 12/19/2018, 08/08/2019   Influenza,inj,quad, With Preservative 12/19/2018   Moderna Sars-Covid-2 Vaccination 01/31/2020, 02/28/2020, 10/14/2020   PNEUMOCOCCAL CONJUGATE-20 11/01/2021   Pneumococcal Polysaccharide-23 04/10/2016   Tdap 04/10/2016      Past Medical History:  Diagnosis Date   Allergy    Anxiety    Chicken pox    Depression    Hyperlipidemia    Thrombocytopenia ([place])    Wears dentures    full lower.  Doesn't wear often.    Current Outpatient Medications on File Prior to Visit  Medication Sig Dispense Refill   fexofenadine (ALLEGRA) 180 MG tablet Take 180 mg by  mouth daily.     rosuvastatin (CRESTOR) 40 MG tablet TAKE 1 TABLET(40 MG) BY MOUTH DAILY 90 tablet 2   No current facility-administered medications on file prior to visit.    Observations/Objective: Patient sounds cheerful and well on the phone. I do not appreciate any SOB. Speech and thought processing are grossly intact. Patient reported vitals:  Assessment and Plan:  Nasal congestion  Facial discomfort  Cough, unspecified type  -we discussed possible serious and likely etiologies, options for evaluation and workup, limitations of telemedicine visit vs in person visit, treatment, treatment risks and precautions. Pt prefers to treat via telemedicine empirically rather than in person at this moment. Query rhinosinusitis vs other. He has opted for repeat covid testing, Tessalon for cough, nasal saline sinus rinse and initiation of abx if worsening or not improving as expected. Discussed risks and appropriate use.  Work/School slipped offered: declined  Advised to seek prompt virtual visit or in person care if worsening, new symptoms arise, or if is not improving with treatment as expected per our conversation of expected course. Discussed options for follow up care. Did let this patient know that I do telemedicine on Tuesdays and Thursdays for [place] and those are the days I am logged into the system. Advised to schedule follow up visit with PCP, [place] virtual visits or [facility] if any further questions or concerns to avoid delays in care.   I discussed the assessment and treatment plan with the patient. The patient was provided an opportunity to ask questions and all were answered. The patient agreed with the plan and demonstrated an understanding of the instructions.  Follow Up Instructions:  I did not refer this patient for an OV with me in the next 24 hours for this/these issue(s).  I discussed the assessment and treatment plan with the patient. The patient was provided an  opportunity to ask questions and all were answered. The patient agreed with the plan and demonstrated an understanding of the instructions.   I spent 14 minutes on the date of this visit in the care of this patient. See summary of tasks completed to properly care for this patient in the detailed notes above which also included counseling of above, review of PMH, medications, allergies, evaluation of the patient and ordering and/or  instructing patient on testing and care options.     Lucretia Kern, DO

## 2022-11-03 ENCOUNTER — Encounter: Payer: Self-pay | Admitting: Family Medicine

## 2022-11-03 ENCOUNTER — Ambulatory Visit (INDEPENDENT_AMBULATORY_CARE_PROVIDER_SITE_OTHER): Payer: Medicare Other | Admitting: Family Medicine

## 2022-11-03 VITALS — BP 118/70 | HR 56 | Temp 97.6°F | Ht 69.0 in | Wt 184.4 lb

## 2022-11-03 DIAGNOSIS — R7989 Other specified abnormal findings of blood chemistry: Secondary | ICD-10-CM

## 2022-11-03 DIAGNOSIS — E663 Overweight: Secondary | ICD-10-CM | POA: Diagnosis not present

## 2022-11-03 DIAGNOSIS — E785 Hyperlipidemia, unspecified: Secondary | ICD-10-CM | POA: Diagnosis not present

## 2022-11-03 DIAGNOSIS — F17210 Nicotine dependence, cigarettes, uncomplicated: Secondary | ICD-10-CM | POA: Diagnosis not present

## 2022-11-03 DIAGNOSIS — Z125 Encounter for screening for malignant neoplasm of prostate: Secondary | ICD-10-CM | POA: Diagnosis not present

## 2022-11-03 DIAGNOSIS — Z0000 Encounter for general adult medical examination without abnormal findings: Secondary | ICD-10-CM | POA: Diagnosis not present

## 2022-11-03 LAB — COMPREHENSIVE METABOLIC PANEL
ALT: 27 U/L (ref 0–53)
AST: 18 U/L (ref 0–37)
Albumin: 4.8 g/dL (ref 3.5–5.2)
Alkaline Phosphatase: 78 U/L (ref 39–117)
BUN: 10 mg/dL (ref 6–23)
CO2: 31 mEq/L (ref 19–32)
Calcium: 9.4 mg/dL (ref 8.4–10.5)
Chloride: 102 mEq/L (ref 96–112)
Creatinine, Ser: 0.9 mg/dL (ref 0.40–1.50)
GFR: 88.47 mL/min (ref >60.00)
Glucose, Bld: 102 mg/dL — ABNORMAL HIGH (ref 70–99)
Potassium: 4.4 mEq/L (ref 3.5–5.1)
Sodium: 139 mEq/L (ref 135–145)
Total Bilirubin: 0.8 mg/dL (ref 0.2–1.2)
Total Protein: 6.7 g/dL (ref 6.0–8.3)

## 2022-11-03 LAB — HEMOGLOBIN A1C: Hgb A1c MFr Bld: 5.6 % (ref 4.6–6.5)

## 2022-11-03 LAB — LIPID PANEL
Cholesterol: 125 mg/dL (ref 0–200)
HDL: 39.3 mg/dL (ref >39.00)
LDL Cholesterol: 54 mg/dL (ref 0–99)
NonHDL: 85.41
Total CHOL/HDL Ratio: 3
Triglycerides: 155 mg/dL — ABNORMAL HIGH (ref 0.0–149.0)
VLDL: 31 mg/dL (ref 0.0–40.0)

## 2022-11-03 LAB — TSH: TSH: 4.71 u[IU]/mL (ref 0.35–5.50)

## 2022-11-03 LAB — PSA, MEDICARE: PSA: 1.22 ng/ml (ref 0.10–4.00)

## 2022-11-03 NOTE — Progress Notes (Signed)
Patient seen along with medical student Zada Zhong.  I personally evaluated this patient along with the student, and verified all aspects of the history, physical exam, and medical decision making as documented by the student.  I agree with the student's documentation and have made all necessary edits.  Kella Splinter, MD  

## 2022-11-03 NOTE — Assessment & Plan Note (Signed)
Physical exam completed today. The patient was encouraged to continue with healthy diet and exercise. Prostate cancer screening with PSA today. He decllines further Covid vaccinations. He was encouraged to get the Shingrix vaccine. He was encouraged to quit smoking. He will be referred for lung cancer screening given his smoking history. Lab work as outlined.

## 2022-11-03 NOTE — Progress Notes (Signed)
[name], MD Phone: [phone]  Jonathan Ingram is a 66 y.o. male who presents today for CPE.  Diet: Eats mainly grilled chicken, along with veggies and fruits. Drinks milk and Nutrisweet tea daily. Exercise: walks his dog daily, and active with his granddaughter Colonoscopy: UTD Prostate cancer screening: PSA due today Family history-  Bladder cancer in his uncle and mother Prostate cancer: neg  Colon cancer: neg Sexually active: yes  Vaccines-   Flu: UTD  Tetanus: UTD  Shingles: due  COVID19: UTD  Pneumonia: UTD HIV screening: no concerns Hep C Screening: UTD Tobacco use: smokes 1-2 cigarettes daily to relax Alcohol use: drinks 1-2 beers on the weekends Illicit Drug use: none Dentist: yes Ophthalmology: yes   Active Ambulatory Problems    Diagnosis Date Noted   Thrombocytopenia ([place]) 01/06/2016   Anxiety 04/10/2016   Insomnia 04/10/2016   Low testosterone 04/10/2016   Hyperlipidemia 04/10/2016   Nicotine dependence, cigarettes, uncomplicated 62/37/6283   Routine general medical examination at a health care facility 04/11/2016   Osteoarthritis 01/30/2018   Elevated blood pressure reading 01/30/2018   Elevated TSH 01/30/2018   Hemangioma of skin 07/08/2018   Allergic rhinitis 01/07/2019   Colon cancer screening 04/15/2019   Inflamed sebaceous cyst 08/08/2019   Polyp of sigmoid colon    Resolved Ambulatory Problems    Diagnosis Date Noted   Splenomegaly 01/06/2016   Otitis media 10/14/2018   Past Medical History:  Diagnosis Date   Allergy    Chicken pox    Depression    Wears dentures     Family History  Problem Relation Age of Onset   Hypertension Mother    Bladder Cancer Mother    Clotting disorder Mother    Clotting disorder Brother    Diabetes Daughter    Hypertension Maternal Uncle    Bladder Cancer Maternal Uncle    Leukemia Paternal Uncle    Liver cancer Maternal Grandmother     Social History   Socioeconomic History    Marital status: Widowed    Spouse name: Not on file   Number of children: Not on file   Years of education: Not on file   Highest education level: Not on file  Occupational History   Not on file  Tobacco Use   Smoking status: Some Days    Packs/day: 0.50    Years: 42.00    Total pack years: 21.00    Types: Cigarettes   Smokeless tobacco: Never  Vaping Use   Vaping Use: Never used  Substance and Sexual Activity   Alcohol use: Yes    Alcohol/week: 1.0 standard drink of alcohol    Types: 1 Standard drinks or equivalent per week    Comment: 4-6 beers on the weekends   Drug use: No   Sexual activity: Not on file  Other Topics Concern   Not on file  Social History Narrative   Not on file   Social Determinants of Health   Financial Resource Strain: Low Risk  (04/26/2022)   Overall Financial Resource Strain (CARDIA)    Difficulty of Paying Living Expenses: Not hard at all  Food Insecurity: No Food Insecurity (08/01/2022)   Hunger Vital Sign    Worried About Running Out of Food in the Last Year: Never true    Ran Out of Food in the Last Year: Never true  Transportation Needs: No Transportation Needs (08/01/2022)   PRAPARE - Transportation    Lack of Transportation (Medical): No    Lack  of Transportation (Non-Medical): No  Physical Activity: Not on file  Stress: No Stress Concern Present (04/26/2022)   [place]    Feeling of Stress : Not at all  Social Connections: Unknown (04/26/2022)   Social Connection and Isolation Panel [NHANES]    Frequency of Communication with Friends and Family: More than three times a week    Frequency of Social Gatherings with Friends and Family: More than three times a week    Attends Religious Services: Not on file    Active Member of [employer] or Organizations: Not on file    Attends Archivist Meetings: Not on file    Marital Status: Not on file  Intimate Partner Violence: Not  At Risk (04/26/2022)   Humiliation, Afraid, Rape, and Kick questionnaire    Fear of Current or Ex-Partner: No    Emotionally Abused: No    Physically Abused: No    Sexually Abused: No    ROS  General:  Negative for nexplained weight loss, fever Skin: Negative for new or changing mole, sore that won't heal HEENT: Negative for trouble hearing, trouble seeing, ringing in ears, mouth sores, hoarseness, change in voice, dysphagia. CV:  Negative for chest pain, dyspnea, edema, palpitations Resp: Negative for cough, dyspnea, hemoptysis GI: Negative for nausea, vomiting, diarrhea, constipation, abdominal pain, melena, hematochezia. GU: Negative for dysuria, incontinence, urinary hesitance, hematuria, vaginal or penile discharge, polyuria, sexual difficulty, lumps in testicle or breasts MSK: Negative for muscle cramps or aches, joint pain or swelling Neuro: Negative for headaches, weakness, numbness, dizziness, passing out/fainting Psych: Negative for depression, anxiety, memory problems  Objective  Vitals:   11/03/22 1006  BP: 118/70  Pulse: (!) 56  Temp: 97.6 F (36.4 C)  SpO2: 97%    BP Readings from Last 3 Encounters:  11/03/22 118/70  12/02/21 116/67  11/01/21 110/70   Wt Readings from Last 3 Encounters:  11/03/22 184 lb 6.4 oz (83.6 kg)  09/21/22 183 lb (83 kg)  04/26/22 176 lb (79.8 kg)    Physical Exam Constitutional:      Appearance: Normal appearance.  HENT:     Head: Normocephalic and atraumatic.  Cardiovascular:     Rate and Rhythm: Normal rate and regular rhythm.  Pulmonary:     Effort: Pulmonary effort is normal.     Breath sounds: Normal breath sounds.  Abdominal:     General: Abdomen is flat. There is no distension.     Palpations: Abdomen is soft.     Tenderness: There is no abdominal tenderness.  Musculoskeletal:     Right lower leg: No edema.     Left lower leg: No edema.  Lymphadenopathy:     Cervical: No cervical adenopathy.  Skin:    General:  Skin is warm and dry.  Neurological:     Mental Status: He is alert.  Psychiatric:        Mood and Affect: Mood normal.      Assessment/Plan:   Problem List Items Addressed This Visit     Elevated TSH   Relevant Orders   TSH   Hyperlipidemia   Relevant Orders   Comp Met (CMET)   Lipid panel   Nicotine dependence, cigarettes, uncomplicated   Relevant Orders   Ambulatory Referral for Lung Cancer Scre   Routine general medical examination at a health care facility - Primary    Physical exam completed today. The patient was encouraged to continue with healthy diet and  exercise. Prostate cancer screening with PSA today. He decllines further Covid vaccinations. He was encouraged to get the Shingrix vaccine. He was encouraged to quit smoking. He will be referred for lung cancer screening given his smoking history. Lab work as outlined.       Other Visit Diagnoses     Overweight       Relevant Orders   HgB A1c   Prostate cancer screening       Relevant Orders   PSA, Medicare ( [place] only)       Return in about 1 year (around 11/04/2023) for cpe.   [name], Medical Student [place]

## 2022-12-08 ENCOUNTER — Other Ambulatory Visit: Payer: Self-pay | Admitting: Family Medicine

## 2022-12-08 DIAGNOSIS — E785 Hyperlipidemia, unspecified: Secondary | ICD-10-CM

## 2022-12-08 NOTE — Telephone Encounter (Signed)
Is it okay to refuse. Rx was discontinued on 11/03/2022.

## 2023-01-01 ENCOUNTER — Other Ambulatory Visit: Payer: Self-pay | Admitting: Family Medicine

## 2023-01-01 ENCOUNTER — Encounter: Payer: Self-pay | Admitting: Family Medicine

## 2023-01-01 ENCOUNTER — Telehealth (INDEPENDENT_AMBULATORY_CARE_PROVIDER_SITE_OTHER): Payer: Medicare Other | Admitting: Family Medicine

## 2023-01-01 VITALS — BP 125/72 | HR 70 | Temp 97.6°F | Ht 70.0 in | Wt 184.0 lb

## 2023-01-01 DIAGNOSIS — J309 Allergic rhinitis, unspecified: Secondary | ICD-10-CM

## 2023-01-01 DIAGNOSIS — J329 Chronic sinusitis, unspecified: Secondary | ICD-10-CM | POA: Insufficient documentation

## 2023-01-01 DIAGNOSIS — J0111 Acute recurrent frontal sinusitis: Secondary | ICD-10-CM

## 2023-01-01 MED ORDER — FLUTICASONE PROPIONATE 50 MCG/ACT NA SUSP: 2.0000 | Freq: Every day | NASAL | 6 refills | Status: DC

## 2023-01-01 MED ORDER — BENZONATATE 200 MG PO CAPS: 200.0000 mg | ORAL_CAPSULE | Freq: Two times a day (BID) | ORAL | 0 refills | Status: DC | PRN

## 2023-01-01 MED ORDER — SACCHAROMYCES BOULARDII 250 MG PO CAPS: 250.0000 mg | ORAL_CAPSULE | Freq: Every day | ORAL | 0 refills | Status: DC

## 2023-01-01 MED ORDER — DOXYCYCLINE HYCLATE 100 MG PO TABS: 100.0000 mg | ORAL_TABLET | Freq: Two times a day (BID) | ORAL | 0 refills | Status: AC

## 2023-01-01 MED ORDER — LEVOCETIRIZINE DIHYDROCHLORIDE 5 MG PO TABS: 5.0000 mg | ORAL_TABLET | Freq: Every evening | ORAL | 0 refills | Status: DC

## 2023-01-01 NOTE — Patient Instructions (Addendum)
It was a pleasure meeting you today. Thank you for allowing me to take part in your health care.  Our goals for today as we discussed include:  Start Flonase 2 spray once daily Start Humidified air at night Start Doxycycline 100 mg two times a day for 5 days Start Tessalon Pearls 200 mg two times a day as needed for cough Take Xyzal 5 mg at night Can take Allegra during the day  Use  nasal saline flush or netty pot  Continue to hydrate   If you have any questions or concerns, please do not hesitate to call the office at [phone].  I look forward to our next visit and until then take care and stay safe.  Regards,   Carollee Leitz, MD   [facility]

## 2023-01-01 NOTE — Assessment & Plan Note (Addendum)
Restart Flonase Continue Allegra Trial Xyzal at night Nasal saline or Nettie pot Recommend ENT referral

## 2023-01-01 NOTE — Assessment & Plan Note (Addendum)
Acute on chronic.  6 days of symptoms with nasal/sinus pressure.  Symptoms not improving with Allegra or Flonase. Doxycycline 100 mg twice daily Tessalon Perles for cough Continue to hydrate Probiotics daily and continue for 2 weeks after completion  antibiotics Given multiple sinus infections recommend he follow-up with PCP for referral to ENT.

## 2023-01-01 NOTE — Progress Notes (Signed)
Virtual Visit via Video note  I connected with Jonathan Ingram on 01/01/23 at 1140 by video and verified that I am speaking with the correct person using two identifiers. Jonathan Ingram is currently located at home and  is currently with him during visit. The provider, Carollee Leitz, MD is located in their home at time of visit.  I discussed the limitations, risks, security and privacy concerns of performing an evaluation and management service by video and the availability of in person appointments. I also discussed with the patient that there may be a patient responsible charge related to this service. The patient expressed understanding and agreed to proceed.  Subjective: PCP: Leone Haven, MD  Chief Complaint  Patient presents with   Acute Visit    Sinus pressure under eyes  Productive cough- clear/ light colored phlemgh x 6 days  Taking Mucinex    HPI  Concern for sinus infection Patient reports symptoms started 6 days ago.  Runny nose, nasal congestion has not developed cough with clear mucus.  Minimal amount of wheezing.  Endorses sinus pressure and nasal pressure.  Had headache 2 to 3 days ago that has now resolved.  Denies any fevers, chest pain, shortness of breath, pleuritic pain, nausea/vomiting, diarrhea or recent sick contacts.  Endorses severe seasonal allergies.  Has been taking Allegra daily without relief.  Restart Flonase today.  Continued tobacco use 3 to 4 cigarettes daily.  Endorses hydrating well.  Last treated for sinus infection 5 to 6 months ago.  ROS: Per HPI  Current Outpatient Medications:    benzonatate (TESSALON) 200 MG capsule, Take 1 capsule (200 mg total) by mouth 2 (two) times daily as needed for cough., Disp: 20 capsule, Rfl: 0   doxycycline (VIBRA-TABS) 100 MG tablet, Take 1 tablet (100 mg total) by mouth 2 (two) times daily for 5 days., Disp: 10 tablet, Rfl: 0   fexofenadine (ALLEGRA) 180 MG tablet, Take 180 mg by mouth daily., Disp: , Rfl:     fluticasone (FLONASE) 50 MCG/ACT nasal spray, Place 2 sprays into both nostrils daily., Disp: 16 g, Rfl: 6   levocetirizine (XYZAL) 5 MG tablet, Take 1 tablet (5 mg total) by mouth every evening., Disp: 30 tablet, Rfl: 0   saccharomyces boulardii (FLORASTOR) 250 MG capsule, Take 1 capsule (250 mg total) by mouth daily., Disp: 90 capsule, Rfl: 0  Observations/Objective: Physical Exam Vitals reviewed.  Constitutional:      General: He is not in acute distress.    Appearance: Normal appearance. He is not toxic-appearing.  Eyes:     Conjunctiva/sclera: Conjunctivae normal.  Pulmonary:     Effort: Pulmonary effort is normal.  Neurological:     Mental Status: He is alert. Mental status is at baseline.  Psychiatric:        Mood and Affect: Mood normal.        Behavior: Behavior normal.        Thought Content: Thought content normal.        Judgment: Judgment normal.    Assessment and Plan: Acute recurrent frontal sinusitis Assessment & Plan: Acute on chronic.  6 days of symptoms with nasal/sinus pressure.  Symptoms not improving with Allegra or Flonase. Doxycycline 100 mg twice daily Tessalon Perles for cough Continue to hydrate Probiotics daily and continue for 2 weeks after completion  antibiotics Given multiple sinus infections recommend he follow-up with PCP for referral to ENT.   Orders: -     Doxycycline Hyclate; Take 1  tablet (100 mg total) by mouth 2 (two) times daily for 5 days.  Dispense: 10 tablet; Refill: 0 -     Saccharomyces boulardii; Take 1 capsule (250 mg total) by mouth daily.  Dispense: 90 capsule; Refill: 0 -     Benzonatate; Take 1 capsule (200 mg total) by mouth 2 (two) times daily as needed for cough.  Dispense: 20 capsule; Refill: 0  Allergic rhinitis, unspecified seasonality, unspecified trigger Assessment & Plan: Restart Flonase Continue Allegra Trial Xyzal at night Nasal saline or Nettie pot Recommend ENT referral  Orders: -     Fluticasone  Propionate; Place 2 sprays into both nostrils daily.  Dispense: 16 g; Refill: 6 -     Levocetirizine Dihydrochloride; Take 1 tablet (5 mg total) by mouth every evening.  Dispense: 30 tablet; Refill: 0    Follow Up Instructions: Return if symptoms worsen or fail to improve.   I discussed the assessment and treatment plan with the patient. The patient was provided an opportunity to ask questions and all were answered. The patient agreed with the plan and demonstrated an understanding of the instructions.   The patient was advised to call back or seek an in-person evaluation if the symptoms worsen or if the condition fails to improve as anticipated.  The above assessment and management plan was discussed with the patient. The patient verbalized understanding of and has agreed to the management plan. Patient is aware to call the clinic if symptoms persist or worsen. Patient is aware when to return to the clinic for a follow-up visit. Patient educated on when it is appropriate to go to the emergency department.   PDMP reviewed  Carollee Leitz, MD

## 2023-01-12 ENCOUNTER — Encounter: Payer: Self-pay | Admitting: *Deleted

## 2023-04-03 ENCOUNTER — Other Ambulatory Visit: Payer: Self-pay | Admitting: Family Medicine

## 2023-04-03 DIAGNOSIS — E785 Hyperlipidemia, unspecified: Secondary | ICD-10-CM

## 2023-04-03 NOTE — Telephone Encounter (Signed)
Medication was discontinued on 11/03/2022. Is it okay to refuse?

## 2023-04-04 NOTE — Telephone Encounter (Signed)
Please contact the patient and see if he has been taking the crestor. Please find out when he last took the crestor. Thanks.

## 2023-04-18 ENCOUNTER — Telehealth: Payer: Self-pay | Admitting: Family Medicine

## 2023-04-18 NOTE — Telephone Encounter (Signed)
Copied from CRM [id number]. Topic: Medicare AWV >> Apr 18, 2023 10:34 AM Payton Doughty wrote: Reason for CRM: Called patient to schedule Medicare Annual Wellness Visit (AWV). Left message for patient to call back and schedule Medicare Annual Wellness Visit (AWV).  Please schedule an appointment at any time with Sydell Axon, CMA  .  If any questions, please contact me.  Thank you ,  Verlee Rossetti; Care Guide Ambulatory Clinical Support [facility] Direct Dial: [phone]

## 2023-07-24 ENCOUNTER — Other Ambulatory Visit: Payer: Self-pay | Admitting: Family Medicine

## 2023-07-24 DIAGNOSIS — J309 Allergic rhinitis, unspecified: Secondary | ICD-10-CM

## 2023-08-22 ENCOUNTER — Ambulatory Visit: Payer: Medicare Other | Admitting: Emergency Medicine

## 2023-08-22 VITALS — Ht 69.0 in | Wt 185.0 lb

## 2023-08-22 DIAGNOSIS — Z0000 Encounter for general adult medical examination without abnormal findings: Secondary | ICD-10-CM

## 2023-08-22 NOTE — Progress Notes (Signed)
Subjective:   Jonathan Ingram is a 67 y.o. male who presents for Medicare Annual/Subsequent preventive examination.  Visit Complete: Virtual  I connected with  Jonathan Ingram on 08/22/23 by a audio enabled telemedicine application and verified that I am speaking with the correct person using two identifiers.  Patient Location: Home  Provider Location: Home Office  I discussed the limitations of evaluation and management by telemedicine. The patient expressed understanding and agreed to proceed.  Because this visit was a virtual/telehealth visit, some criteria may be missing or patient reported. Any vitals not documented were not able to be obtained and vitals that have been documented are patient reported.    Cardiac Risk Factors include: advanced age (>33men, >30 women);male gender;dyslipidemia;smoking/ tobacco exposure     Objective:    Today's Vitals   08/22/23 0944  Weight: 185 lb (83.9 kg)  Height: 5\' 9"  (1.753 m)   Body mass index is 27.32 kg/m.     08/22/2023   10:06 AM 04/26/2022    1:06 PM 01/26/2016    9:21 AM 01/05/2016    9:22 AM  Advanced Directives  Does Patient Have a Medical Advance Directive? No No No No  Would patient like information on creating a medical advance directive? Yes (MAU/Ambulatory/Procedural Areas - Information given) No - Patient declined Yes - Educational materials given No - patient declined information    Current Medications (verified) Outpatient Encounter Medications as of 08/22/2023  Medication Sig   cholecalciferol (VITAMIN D3) 25 MCG (1000 UNIT) tablet Take 1,000 Units by mouth daily.   fexofenadine (ALLEGRA) 180 MG tablet Take 180 mg by mouth daily.   fluticasone (FLONASE) 50 MCG/ACT nasal spray SHAKE LIQUID AND USE 2 SPRAYS IN EACH NOSTRIL DAILY   levocetirizine (XYZAL) 5 MG tablet TAKE 1 TABLET(5 MG) BY MOUTH EVERY EVENING   rosuvastatin (CRESTOR) 40 MG tablet TAKE 1 TABLET(40 MG) BY MOUTH DAILY   benzonatate (TESSALON) 200 MG  capsule Take 1 capsule (200 mg total) by mouth 2 (two) times daily as needed for cough. (Patient not taking: Reported on 08/22/2023)   saccharomyces boulardii (FLORASTOR) 250 MG capsule Take 1 capsule (250 mg total) by mouth daily. (Patient not taking: Reported on 08/22/2023)   No facility-administered encounter medications on file as of 08/22/2023.    Allergies (verified) Penicillins   History: Past Medical History:  Diagnosis Date   Allergy    Anxiety    Chicken pox    Depression    Hyperlipidemia    Thrombocytopenia (HCC)    Wears dentures    full lower.  Doesn't wear often.   Past Surgical History:  Procedure Laterality Date   COLONOSCOPY WITH PROPOFOL N/A 12/02/2021   Procedure: COLONOSCOPY WITH PROPOFOL;  Surgeon: Midge Minium, MD;  Location[facility];  Service: Endoscopy;  Laterality: N/A;  needs to be last   NO PAST SURGERIES     POLYPECTOMY N/A 12/02/2021   Procedure: POLYPECTOMY;  Surgeon: Midge Minium, MD;  Location: [facility];  Service: Endoscopy;  Laterality: N/A;   Family History  Problem Relation Age of Onset   Hypertension Mother    Bladder Cancer Mother    Clotting disorder Mother    Hyperlipidemia Father    Aneurysm Father    Clotting disorder Brother    Diabetes Daughter    Hypertension Maternal Uncle    Bladder Cancer Maternal Uncle    Leukemia Paternal Uncle    Liver cancer Maternal Grandmother    Social History  Socioeconomic History   Marital status: Widowed    Spouse name: Not on file   Number of children: 1   Years of education: Not on file   Highest education level: Not on file  Occupational History   Occupation: retired  Tobacco Use   Smoking status: Some Days    Current packs/day: 0.25    Average packs/day: 0.4 packs/day for 39.7 years (15.4 ttl pk-yrs)    Types: Cigarettes    Start date: 10    Last attempt to quit: 2001   Smokeless tobacco: Never   Tobacco comments:    08/22/23 Smoking 1-2 cigarettes daily   Vaping Use   Vaping status: Never Used  Substance and Sexual Activity   Alcohol use: Yes    Alcohol/week: 1.0 standard drink of alcohol    Types: 1 Standard drinks or equivalent per week    Comment: 3-4 beers on the weekends   Drug use: No   Sexual activity: Not on file  Other Topics Concern   Not on file  Social History Narrative   Widowed, 1 adult child, 2 step children, 1 granddaughter that lives with him and he raises, mother in law lives with him   Social Determinants of Health   Financial Resource Strain: Low Risk  (08/22/2023)   Overall Financial Resource Strain (CARDIA)    Difficulty of Paying Living Expenses: Not hard at all  Food Insecurity: No Food Insecurity (08/22/2023)   Hunger Vital Sign    Worried About Running Out of Food in the Last Year: Never true    Ran Out of Food in the Last Year: Never true  Transportation Needs: No Transportation Needs (08/22/2023)   PRAPARE - Administrator, Civil Service (Medical): No    Lack of Transportation (Non-Medical): No  Physical Activity: Insufficiently Active (08/22/2023)   Exercise Vital Sign    Days of Exercise per Week: 5 days    Minutes of Exercise per Session: 20 min  Stress: No Stress Concern Present (08/22/2023)   [employer] of Occupational Health - Occupational Stress Questionnaire    Feeling of Stress : Not at all  Social Connections: Moderately Isolated (08/22/2023)   Social Connection and Isolation Panel [NHANES]    Frequency of Communication with Friends and Family: More than three times a week    Frequency of Social Gatherings with Friends and Family: Once a week    Attends Religious Services: More than 4 times per year    Active Member of [employer] or Organizations: No    Attends Banker Meetings: Never    Marital Status: Widowed    Tobacco Counseling Ready to quit: Not Answered Counseling given: Not Answered Tobacco comments: 08/22/23 Smoking 1-2 cigarettes daily   Clinical  Intake:  Pre-visit preparation completed: Yes  Pain : No/denies pain     BMI - recorded: 27.32 Nutritional Status: BMI 25 -29 Overweight Nutritional Risks: None Diabetes: No  How often do you need to have someone help you when you read instructions, pamphlets, or other written materials from your doctor or pharmacy?: 1 - Never  Interpreter Needed?: No  Information entered by :: Tora Kindred, CMA   Activities of Daily Living    08/22/2023    9:46 AM  In your present state of health, do you have any difficulty performing the following activities:  Hearing? 0  Vision? 0  Difficulty concentrating or making decisions? 0  Walking or climbing stairs? 0  Dressing or bathing? 0  Doing  errands, shopping? 0  Preparing Food and eating ? N  Using the Toilet? N  In the past six months, have you accidently leaked urine? N  Do you have problems with loss of bowel control? N  Managing your Medications? N  Managing your Finances? N  Housekeeping or managing your Housekeeping? N    Patient Care Team: Glori Luis, MD as PCP - General (Family Medicine)  Indicate any recent Medical Services you may have received from other than [facility] providers in the past year (date may be approximate).     Assessment:   This is a routine wellness examination for Gibril.  Hearing/Vision screen Hearing Screening - Comments:: Denies hearing loss Vision Screening - Comments:: Gets routine eye exams   Goals Addressed             This Visit's Progress    Quit Smoking        Depression Screen    08/22/2023   10:04 AM 01/01/2023   11:23 AM 11/03/2022   10:08 AM 04/26/2022    1:04 PM 08/08/2019    4:09 PM 10/14/2018    4:04 PM  PHQ 2/9 Scores  PHQ - 2 Score 0 0 0 0 0 0  PHQ- 9 Score 0 0        Fall Risk    08/22/2023   10:07 AM 01/01/2023   11:22 AM 11/03/2022   10:10 AM 11/03/2022   10:08 AM 04/26/2022    1:07 PM  Fall Risk   Falls in the past year? 0 0 0 0 0  Number falls in past yr:  0 0 0 0 0  Injury with Fall? 0 0 0  0  Risk for fall due to : No Fall Risks No Fall Risks No Fall Risks No Fall Risks   Follow up Falls prevention discussed Falls evaluation completed Falls evaluation completed Falls evaluation completed Falls evaluation completed    MEDICARE RISK AT HOME: Medicare Risk at Home Any stairs in or around the home?: Yes If so, are there any without handrails?: No Home free of loose throw rugs in walkways, pet beds, electrical cords, etc?: Yes Adequate lighting in your home to reduce risk of falls?: Yes Life alert?: No Use of a cane, walker or w/c?: No Grab bars in the bathroom?: Yes Shower chair or bench in shower?: No Elevated toilet seat or a handicapped toilet?: No  TIMED UP AND GO:  Was the test performed?  No    Cognitive Function:        08/22/2023   10:08 AM  6CIT Screen  What Year? 0 points  What month? 0 points  What time? 0 points  Count back from 20 0 points  Months in reverse 0 points  Repeat phrase 0 points  Total Score 0 points    Immunizations Immunization History  Administered Date(s) Administered   Fluad Quad(high Dose 65+) 10/19/2021   Influenza, High Dose Seasonal PF 10/12/2022   Influenza,inj,Quad PF,6+ Mos 01/30/2018, 12/19/2018, 08/08/2019   Influenza,inj,quad, With Preservative 12/19/2018   Moderna Sars-Covid-2 Vaccination 01/31/2020, 02/28/2020, 10/14/2020   PNEUMOCOCCAL CONJUGATE-20 11/01/2021   Pneumococcal Polysaccharide-23 04/10/2016   Tdap 04/10/2016    TDAP status: Up to date  Flu Vaccine status: Due, Education has been provided regarding the importance of this vaccine. Advised may receive this vaccine at local pharmacy or Health Dept. Aware to provide a copy of the vaccination record if obtained from local pharmacy or Health Dept. Verbalized acceptance and understanding.  Pneumococcal vaccine status: Up to date  Covid-19 vaccine status: Information provided on how to obtain vaccines.   Qualifies for  Shingles Vaccine? Yes   Zostavax completed No   Shingrix Completed?: No.    Education has been provided regarding the importance of this vaccine. Patient has been advised to call insurance company to determine out of pocket expense if they have not yet received this vaccine. Advised may also receive vaccine at local pharmacy or Health Dept. Verbalized acceptance and understanding.  Screening Tests Health Maintenance  Topic Date Due   Zoster Vaccines- Shingrix (1 of 2) Never done   Lung Cancer Screening  08/14/2012   INFLUENZA VACCINE  06/28/2023   COVID-19 Vaccine (4 - 2023-24 season) 07/29/2023   Medicare Annual Wellness (AWV)  08/21/2024   Colonoscopy  12/02/2024   DTaP/Tdap/Td (2 - Td or Tdap) 04/10/2026   Pneumonia Vaccine 30+ Years old  Completed   Hepatitis C Screening  Completed   HPV VACCINES  Aged Out    Health Maintenance  Health Maintenance Due  Topic Date Due   Zoster Vaccines- Shingrix (1 of 2) Never done   Lung Cancer Screening  08/14/2012   INFLUENZA VACCINE  06/28/2023   COVID-19 Vaccine (4 - 2023-24 season) 07/29/2023    Colorectal cancer screening: Type of screening: Colonoscopy. Completed 12/02/21. Repeat every 3 years  Lung Cancer Screening: (Low Dose CT Chest recommended if Age 35-80 years, 20 pack-year currently smoking OR have quit w/in 15years.) does not qualify.   Lung Cancer Screening Referral: n/a  Additional Screening:  Hepatitis C Screening: does not qualify; Completed 01/05/16  Vision Screening: Recommended annual ophthalmology exams for early detection of glaucoma and other disorders of the eye. I Dental Screening: Recommended annual dental exams for proper oral hygiene    Community Resource Referral / Chronic Care Management: CRR required this visit?  No   CCM required this visit?  No     Plan:     I have personally reviewed and noted the following in the patient's chart:   Medical and social history Use of alcohol, tobacco or  illicit drugs  Current medications and supplements including opioid prescriptions. Patient is not currently taking opioid prescriptions. Functional ability and status Nutritional status Physical activity Advanced directives List of other physicians Hospitalizations, surgeries, and ER visits in previous 12 months Vitals Screenings to include cognitive, depression, and falls Referrals and appointments  In addition, I have reviewed and discussed with patient certain preventive protocols, quality metrics, and best practice recommendations. A written personalized care plan for preventive services as well as general preventive health recommendations were provided to patient.     Tora Kindred, CMA   08/22/2023   After Visit Summary: (MyChart) Due to this being a telephonic visit, the after visit summary with patients personalized plan was offered to patient via MyChart   Nurse Notes:  Updated smoking history. Patient trying to quit. Smoking 1-2 cigarettes daily. Will get flu and shingles vaccines. Recommended RSV vaccine.

## 2023-08-22 NOTE — Patient Instructions (Addendum)
Jonathan Ingram , Thank you for taking time to come for your Medicare Wellness Visit. I appreciate your ongoing commitment to your health goals. Please review the following plan we discussed and let me know if I can assist you in the future.   Referrals/Orders/Follow-Ups/Clinician Recommendations: Get the flu, shingles and RSV vaccines at your earliest convenience.  This is a list of the screening recommended for you and due dates:  Health Maintenance  Topic Date Due   Zoster (Shingles) Vaccine (1 of 2) Never done   COVID-19 Vaccine (4 - 2023-24 season) 07/29/2023   Flu Shot  02/25/2024*   Medicare Annual Wellness Visit  08/21/2024   Colon Cancer Screening  12/02/2024   DTaP/Tdap/Td vaccine (2 - Td or Tdap) 04/10/2026   Pneumonia Vaccine  Completed   Hepatitis C Screening  Completed   HPV Vaccine  Aged Out   Screening for Lung Cancer  Discontinued  *Topic was postponed. The date shown is not the original due date.    Advanced directives: (ACP Link)Information on Advanced Care Planning can be found at [facility] Advance Health Care Directives Advance Health Care Directives ([email or web address]   Please bring a copy of your health care power of attorney and living will to the office to be added to your chart at your convenience.   Next Medicare Annual Wellness Visit scheduled for next year: Yes, 08/27/24 @ 9am

## 2023-09-22 ENCOUNTER — Other Ambulatory Visit: Payer: Self-pay | Admitting: Family Medicine

## 2023-09-22 DIAGNOSIS — J309 Allergic rhinitis, unspecified: Secondary | ICD-10-CM

## 2023-09-26 ENCOUNTER — Other Ambulatory Visit: Payer: Self-pay | Admitting: Family Medicine

## 2023-09-26 DIAGNOSIS — J309 Allergic rhinitis, unspecified: Secondary | ICD-10-CM

## 2023-11-06 ENCOUNTER — Encounter: Payer: Self-pay | Admitting: Family Medicine

## 2023-11-06 ENCOUNTER — Ambulatory Visit (INDEPENDENT_AMBULATORY_CARE_PROVIDER_SITE_OTHER): Payer: Medicare Other | Admitting: Family Medicine

## 2023-11-06 VITALS — BP 118/68 | HR 55 | Temp 97.5°F | Ht 69.0 in | Wt 181.6 lb

## 2023-11-06 DIAGNOSIS — E663 Overweight: Secondary | ICD-10-CM | POA: Diagnosis not present

## 2023-11-06 DIAGNOSIS — E785 Hyperlipidemia, unspecified: Secondary | ICD-10-CM | POA: Diagnosis not present

## 2023-11-06 DIAGNOSIS — Z1329 Encounter for screening for other suspected endocrine disorder: Secondary | ICD-10-CM | POA: Diagnosis not present

## 2023-11-06 DIAGNOSIS — Z130 Encounter for screening for diseases of the blood and blood-forming organs and certain disorders involving the immune mechanism: Secondary | ICD-10-CM | POA: Diagnosis not present

## 2023-11-06 DIAGNOSIS — Z125 Encounter for screening for malignant neoplasm of prostate: Secondary | ICD-10-CM | POA: Diagnosis not present

## 2023-11-06 DIAGNOSIS — Z0000 Encounter for general adult medical examination without abnormal findings: Secondary | ICD-10-CM | POA: Diagnosis not present

## 2023-11-06 LAB — COMPREHENSIVE METABOLIC PANEL
ALT: 21 U/L (ref 0–53)
AST: 16 U/L (ref 0–37)
Albumin: 4.6 g/dL (ref 3.5–5.2)
Alkaline Phosphatase: 79 U/L (ref 39–117)
BUN: 11 mg/dL (ref 6–23)
CO2: 30 meq/L (ref 19–32)
Calcium: 8.9 mg/dL (ref 8.4–10.5)
Chloride: 101 meq/L (ref 96–112)
Creatinine, Ser: 0.98 mg/dL (ref 0.40–1.50)
GFR: 79.32 mL/min (ref >60.00)
Glucose, Bld: 105 mg/dL — ABNORMAL HIGH (ref 70–99)
Potassium: 4.5 meq/L (ref 3.5–5.1)
Sodium: 139 meq/L (ref 135–145)
Total Bilirubin: 0.8 mg/dL (ref 0.2–1.2)
Total Protein: 6.6 g/dL (ref 6.0–8.3)

## 2023-11-06 LAB — CBC
HCT: 48.2 % (ref 39.0–52.0)
Hemoglobin: 16.6 g/dL (ref 13.0–17.0)
MCHC: 34.4 g/dL (ref 30.0–36.0)
MCV: 99.3 fL (ref 78.0–100.0)
Platelets: 167 10*3/uL (ref 150.0–400.0)
RBC: 4.85 Mil/uL (ref 4.22–5.81)
RDW: 13.6 % (ref 11.5–15.5)
WBC: 5 10*3/uL (ref 4.0–10.5)

## 2023-11-06 LAB — LIPID PANEL
Cholesterol: 140 mg/dL (ref 0–200)
HDL: 40 mg/dL (ref >39.00)
LDL Cholesterol: 66 mg/dL (ref 0–99)
NonHDL: 99.66
Total CHOL/HDL Ratio: 3
Triglycerides: 170 mg/dL — ABNORMAL HIGH (ref 0.0–149.0)
VLDL: 34 mg/dL (ref 0.0–40.0)

## 2023-11-06 LAB — HEMOGLOBIN A1C: Hgb A1c MFr Bld: 5.5 % (ref 4.6–6.5)

## 2023-11-06 LAB — TSH: TSH: 5.97 u[IU]/mL — ABNORMAL HIGH (ref 0.35–5.50)

## 2023-11-06 LAB — PSA, MEDICARE: PSA: 1.38 ng/mL (ref 0.10–4.00)

## 2023-11-06 NOTE — Assessment & Plan Note (Signed)
Physical exam completed.  Encouraged healthy diet and exercise.  PSA to be completed today for prostate cancer screening.  Encourage patient to get his second shingles vaccine when he is due for this.  Encouraged smoking cessation.  Advised to not drink any more than 3 alcoholic beverages at a time given risk of binge drinking.  Lab work as outlined.

## 2023-11-06 NOTE — Progress Notes (Signed)
Marikay Alar, MD Phone: [phone]  Jonathan Ingram is a 67 y.o. male who presents today for CPe.  Diet: Plenty of fruits and vegetables Lots of [place], two sweet teas per day, drinks lots of water, not much junk food Exercise: Walks daily Colonoscopy: Up-to-date Prostate cancer screening: Due Family history-  Prostate cancer: no  Colon cancer: no Vaccines-   Flu: UTD  Tetanus: UTD  Shingles: x1, due for second in the new year  COVID19: x3  Pneumonia: UTD Hep C Screening: UTD Tobacco use: occasionally has one cigarette, sporadically smoked throughout his life, never more than 1/2 PPD Alcohol use: rarely has 3-4 beers Illicit Drug use: no Dentist: yes Ophthalmology: yes   Active Ambulatory Problems    Diagnosis Date Noted   Anxiety 04/10/2016   Insomnia 04/10/2016   Low testosterone 04/10/2016   Hyperlipidemia 04/10/2016   Nicotine dependence, cigarettes, uncomplicated 04/10/2016   Routine general medical examination at a health care facility 04/11/2016   Osteoarthritis 01/30/2018   Elevated blood pressure reading 01/30/2018   Hemangioma of skin 07/08/2018   Allergic rhinitis 01/07/2019   Colon cancer screening 04/15/2019   Inflamed sebaceous cyst 08/08/2019   Polyp of sigmoid colon    Sinusitis 01/01/2023   Resolved Ambulatory Problems    Diagnosis Date Noted   Thrombocytopenia (HCC) 01/06/2016   Splenomegaly 01/06/2016   Elevated TSH 01/30/2018   Otitis media 10/14/2018   Past Medical History:  Diagnosis Date   Allergy    Arthritis 2020   Cataract 2022   Chicken pox    Depression    Wears dentures     Family History  Problem Relation Age of Onset   Hypertension Mother    Bladder Cancer Mother    Clotting disorder Mother    Arthritis Mother    Hyperlipidemia Father    Aneurysm Father    Clotting disorder Brother    Diabetes Daughter    Hypertension Maternal Uncle    Bladder Cancer Maternal Uncle    Leukemia Paternal Uncle    Liver cancer  Maternal Grandmother    Arthritis Maternal Aunt     Social History   Socioeconomic History   Marital status: Widowed    Spouse name: Not on file   Number of children: 1   Years of education: Not on file   Highest education level: Not on file  Occupational History   Occupation: retired  Tobacco Use   Smoking status: Some Days    Current packs/day: 0.25    Average packs/day: 0.4 packs/day for 39.9 years (15.5 ttl pk-yrs)    Types: Cigarettes    Start date: 1976    Last attempt to quit: 2001   Smokeless tobacco: Never   Tobacco comments:    08/22/23 Smoking 1-2 cigarettes daily  Vaping Use   Vaping status: Never Used  Substance and Sexual Activity   Alcohol use: Not Currently    Alcohol/week: 1.0 standard drink of alcohol    Types: 1 Standard drinks or equivalent per week    Comment: 3-4 beers on the weekends   Drug use: No   Sexual activity: Yes  Other Topics Concern   Not on file  Social History Narrative   Widowed, 1 adult child, 2 step children, 1 granddaughter that lives with him and he raises, mother in law lives with him   Social Determinants of Health   Financial Resource Strain: Low Risk  (08/22/2023)   Overall Financial Resource Strain (CARDIA)    Difficulty of  Paying Living Expenses: Not hard at all  Food Insecurity: No Food Insecurity (08/22/2023)   Hunger Vital Sign    Worried About Running Out of Food in the Last Year: Never true    Ran Out of Food in the Last Year: Never true  Transportation Needs: No Transportation Needs (08/22/2023)   PRAPARE - Administrator, Civil Service (Medical): No    Lack of Transportation (Non-Medical): No  Physical Activity: Insufficiently Active (08/22/2023)   Exercise Vital Sign    Days of Exercise per Week: 5 days    Minutes of Exercise per Session: 20 min  Stress: No Stress Concern Present (08/22/2023)   [employer] of Occupational Health - Occupational Stress Questionnaire    Feeling of Stress : Not at  all  Social Connections: Moderately Isolated (08/22/2023)   Social Connection and Isolation Panel [NHANES]    Frequency of Communication with Friends and Family: More than three times a week    Frequency of Social Gatherings with Friends and Family: Once a week    Attends Religious Services: More than 4 times per year    Active Member of [employer] or Organizations: No    Attends Banker Meetings: Never    Marital Status: Widowed  Intimate Partner Violence: Not At Risk (08/22/2023)   Humiliation, Afraid, Rape, and Kick questionnaire    Fear of Current or Ex-Partner: No    Emotionally Abused: No    Physically Abused: No    Sexually Abused: No    ROS  General:  Negative for nexplained weight loss, fever Skin: Negative for new or changing mole, sore that won't heal HEENT: Negative for trouble hearing, trouble seeing, ringing in ears, mouth sores, hoarseness, change in voice, dysphagia. CV:  Negative for chest pain, dyspnea, edema, palpitations Resp: Negative for cough, dyspnea, hemoptysis GI: Negative for nausea, vomiting, diarrhea, constipation, abdominal pain, melena, hematochezia. GU: Negative for dysuria, incontinence, urinary hesitance, hematuria, vaginal or penile discharge, polyuria, sexual difficulty, lumps in testicle or breasts MSK: Negative for muscle cramps or aches, joint pain or swelling Neuro: Negative for headaches, weakness, numbness, dizziness, passing out/fainting Psych: Negative for depression, anxiety, memory problems  Objective  Physical Exam Vitals:   11/06/23 0926  BP: 118/68  Pulse: (!) 55  Temp: (!) 97.5 F (36.4 C)  SpO2: 98%    BP Readings from Last 3 Encounters:  11/06/23 118/68  01/01/23 125/72  11/03/22 118/70   Wt Readings from Last 3 Encounters:  11/06/23 181 lb 9.6 oz (82.4 kg)  08/22/23 185 lb (83.9 kg)  01/01/23 184 lb (83.5 kg)    Physical Exam Constitutional:      General: He is not in acute distress.    Appearance: He  is not diaphoretic.  HENT:     Head: Normocephalic and atraumatic.  Cardiovascular:     Rate and Rhythm: Regular rhythm. Bradycardia present.     Heart sounds: Normal heart sounds.  Pulmonary:     Effort: Pulmonary effort is normal.     Breath sounds: Normal breath sounds.  Abdominal:     General: Bowel sounds are normal. There is no distension.     Palpations: Abdomen is soft.     Tenderness: There is no abdominal tenderness.  Musculoskeletal:     Right lower leg: No edema.     Left lower leg: No edema.  Lymphadenopathy:     Cervical: No cervical adenopathy.  Skin:    General: Skin is warm and dry.  Neurological:     Mental Status: He is alert.  Psychiatric:        Mood and Affect: Mood normal.      Assessment/Plan:   Routine general medical examination at a health care facility Assessment & Plan: Physical exam completed.  Encouraged healthy diet and exercise.  PSA to be completed today for prostate cancer screening.  Encourage patient to get his second shingles vaccine when he is due for this.  Encouraged smoking cessation.  Advised to not drink any more than 3 alcoholic beverages at a time given risk of binge drinking.  Lab work as outlined.   Hyperlipidemia, unspecified hyperlipidemia type -     Comprehensive metabolic panel -     Lipid panel  Prostate cancer screening -     PSA, Medicare  Thyroid disorder screen -     TSH  Screening for deficiency anemia -     CBC  Overweight -     Hemoglobin A1c    Return in about 1 year (around 11/05/2024) for physical with Kacy.   Marikay Alar, MD [facility]

## 2023-11-07 ENCOUNTER — Other Ambulatory Visit (INDEPENDENT_AMBULATORY_CARE_PROVIDER_SITE_OTHER): Payer: Medicare Other

## 2023-11-07 ENCOUNTER — Other Ambulatory Visit: Payer: Self-pay | Admitting: Family Medicine

## 2023-11-07 DIAGNOSIS — R7989 Other specified abnormal findings of blood chemistry: Secondary | ICD-10-CM | POA: Diagnosis not present

## 2023-11-07 DIAGNOSIS — R946 Abnormal results of thyroid function studies: Secondary | ICD-10-CM

## 2023-11-07 LAB — T4, FREE: Free T4: 0.65 ng/dL (ref 0.60–1.60)

## 2023-11-08 LAB — T3: T3, Total: 76 ng/dL (ref 76–181)

## 2023-12-27 ENCOUNTER — Other Ambulatory Visit: Payer: Self-pay | Admitting: Family Medicine

## 2023-12-27 DIAGNOSIS — E785 Hyperlipidemia, unspecified: Secondary | ICD-10-CM

## 2024-01-24 ENCOUNTER — Ambulatory Visit (INDEPENDENT_AMBULATORY_CARE_PROVIDER_SITE_OTHER): Payer: No Typology Code available for payment source | Admitting: Nurse Practitioner

## 2024-01-24 ENCOUNTER — Encounter: Payer: Self-pay | Admitting: Nurse Practitioner

## 2024-01-24 VITALS — BP 110/78 | HR 55 | Temp 97.5°F | Ht 69.0 in | Wt 184.0 lb

## 2024-01-24 DIAGNOSIS — E785 Hyperlipidemia, unspecified: Secondary | ICD-10-CM

## 2024-01-24 DIAGNOSIS — J309 Allergic rhinitis, unspecified: Secondary | ICD-10-CM | POA: Diagnosis not present

## 2024-01-24 MED ORDER — LEVOCETIRIZINE DIHYDROCHLORIDE 5 MG PO TABS: 5.0000 mg | ORAL_TABLET | Freq: Every evening | ORAL | 3 refills | Status: AC

## 2024-01-24 NOTE — Assessment & Plan Note (Signed)
 Well controlled on Crestor 40 mg daily with no reported side effects. Continue Crestor.

## 2024-01-24 NOTE — Progress Notes (Signed)
 Jonathan Dicker, NP-C Phone: [phone]  Jonathan Ingram is a 68 y.o. male who presents today for transfer of care.   Discussed the use of AI scribe software for clinical note transcription with the patient, who gave verbal consent to proceed.  History of Present Illness   Jonathan Ingram is a 68 year old male who presents for a transfer of care visit.  He reports no new problems or concerns since his annual exam in December.  He has a history of high cholesterol and is currently taking Crestor to manage this condition. No abdominal pain or new muscle aches are present.  He has allergies and manages his symptoms with daily Flonase and Allegra. He recalls a past episode of frequent sneezing, effectively managed with a medication prescribed during a video visit last year, described as 'little bitty white pills' that worked well for his symptoms.  He enjoys walking with his dogs on his property, which is about a half-mile down and back.      Social History   Tobacco Use  Smoking Status Some Days   Current packs/day: 0.25   Average packs/day: 0.4 packs/day for 40.2 years (15.5 ttl pk-yrs)   Types: Cigarettes   Start date: 1976   Last attempt to quit: 2001  Smokeless Tobacco Never  Tobacco Comments   08/22/23 Smoking 1-2 cigarettes daily    Current Outpatient Medications on File Prior to Visit  Medication Sig Dispense Refill   cholecalciferol (VITAMIN D3) 25 MCG (1000 UNIT) tablet Take 1,000 Units by mouth daily.     fexofenadine (ALLEGRA) 180 MG tablet Take 180 mg by mouth daily.     fluticasone (FLONASE) 50 MCG/ACT nasal spray SHAKE LIQUID AND USE 2 SPRAYS IN EACH NOSTRIL DAILY 16 g 6   rosuvastatin (CRESTOR) 40 MG tablet TAKE 1 TABLET(40 MG) BY MOUTH DAILY 90 tablet 2   No current facility-administered medications on file prior to visit.    ROS see history of present illness  Objective  Physical Exam Vitals:   01/24/24 0946  BP: 110/78  Pulse: (!) 55  Temp: (!) 97.5  F (36.4 C)  SpO2: 97%    BP Readings from Last 3 Encounters:  01/24/24 110/78  11/06/23 118/68  01/01/23 125/72   Wt Readings from Last 3 Encounters:  01/24/24 184 lb (83.5 kg)  11/06/23 181 lb 9.6 oz (82.4 kg)  08/22/23 185 lb (83.9 kg)    Physical Exam Constitutional:      General: He is not in acute distress.    Appearance: Normal appearance.  HENT:     Head: Normocephalic.  Cardiovascular:     Rate and Rhythm: Normal rate and regular rhythm.     Heart sounds: Normal heart sounds.  Pulmonary:     Effort: Pulmonary effort is normal.     Breath sounds: Normal breath sounds.  Skin:    General: Skin is warm and dry.  Neurological:     General: No focal deficit present.     Mental Status: He is alert.  Psychiatric:        Mood and Affect: Mood normal.        Behavior: Behavior normal.    Assessment/Plan: Please see individual problem list.  Hyperlipidemia, unspecified hyperlipidemia type Assessment & Plan: Well controlled on Crestor 40 mg daily with no reported side effects. Continue Crestor.   Allergic rhinitis, unspecified seasonality, unspecified trigger Assessment & Plan: Daily symptoms are controlled with Flonase and Allegra. Continue. Reported improvement with addition of  Xyzal last year. Will restart Xyzal daily at bedtime.   Orders: -     Levocetirizine Dihydrochloride; Take 1 tablet (5 mg total) by mouth every evening.  Dispense: 90 tablet; Refill: 3   Return in 10 months (on 11/11/2024) for Annual Exam as scheduled, sooner as needed.   Jonathan Dicker, NP-C [facility]

## 2024-01-24 NOTE — Assessment & Plan Note (Signed)
 Daily symptoms are controlled with Flonase and Allegra. Continue. Reported improvement with addition of Xyzal last year. Will restart Xyzal daily at bedtime.

## 2024-02-28 DIAGNOSIS — Z6827 Body mass index (BMI) 27.0-27.9, adult: Secondary | ICD-10-CM | POA: Diagnosis not present

## 2024-02-28 DIAGNOSIS — F17210 Nicotine dependence, cigarettes, uncomplicated: Secondary | ICD-10-CM | POA: Diagnosis not present

## 2024-02-28 DIAGNOSIS — E663 Overweight: Secondary | ICD-10-CM | POA: Diagnosis not present

## 2024-02-28 DIAGNOSIS — Z008 Encounter for other general examination: Secondary | ICD-10-CM | POA: Diagnosis not present

## 2024-02-29 ENCOUNTER — Other Ambulatory Visit: Payer: Self-pay

## 2024-02-29 DIAGNOSIS — J309 Allergic rhinitis, unspecified: Secondary | ICD-10-CM

## 2024-02-29 MED ORDER — FLUTICASONE PROPIONATE 50 MCG/ACT NA SUSP: 2.0000 | Freq: Every day | NASAL | 6 refills | Status: AC

## 2024-09-03 ENCOUNTER — Ambulatory Visit: Admitting: *Deleted

## 2024-09-03 VITALS — Ht 69.0 in | Wt 180.0 lb

## 2024-09-03 DIAGNOSIS — Z0000 Encounter for general adult medical examination without abnormal findings: Secondary | ICD-10-CM

## 2024-09-03 NOTE — Patient Instructions (Signed)
 Jonathan Ingram,  Thank you for taking the time for your Medicare Wellness Visit. I appreciate your continued commitment to your health goals. Please review the care plan we discussed, and feel free to reach out if I can assist you further.  Medicare recommends these wellness visits once per year to help you and your care team stay ahead of potential health issues. These visits are designed to focus on prevention, allowing your provider to concentrate on managing your acute and chronic conditions during your regular appointments.  Please note that Annual Wellness Visits do not include a physical exam. Some assessments may be limited, especially if the visit was conducted virtually. If needed, we may recommend a separate in-person follow-up with your provider.  Ongoing Care Seeing your primary care provider every 3 to 6 months helps us  monitor your health and provide consistent, personalized care.  Remember to continue to update your flu vaccine annually.  Referrals If a referral was made during today's visit and you haven't received any updates within two weeks, please contact the referred provider directly to check on the status.  Recommended Screenings:  Health Maintenance  Topic Date Due   COVID-19 Vaccine (4 - 2025-26 season) 07/28/2024   Colon Cancer Screening  12/02/2024   Medicare Annual Wellness Visit  09/03/2025   DTaP/Tdap/Td vaccine (2 - Td or Tdap) 04/10/2026   Pneumococcal Vaccine for age over 60  Completed   Flu Shot  Completed   Hepatitis C Screening  Completed   Zoster (Shingles) Vaccine  Completed   Meningitis B Vaccine  Aged Out   Screening for Lung Cancer  Discontinued       09/03/2024    9:07 AM  Advanced Directives  Does Patient Have a Medical Advance Directive? No  Would patient like information on creating a medical advance directive? No - Patient declined   Advance Care Planning is important because it: Ensures you receive medical care that aligns with your  values, goals, and preferences. Provides guidance to your family and loved ones, reducing the emotional burden of decision-making during critical moments.  Vision: Annual vision screenings are recommended for early detection of glaucoma, cataracts, and diabetic retinopathy. These exams can also reveal signs of chronic conditions such as diabetes and high blood pressure.  Dental: Annual dental screenings help detect early signs of oral cancer, gum disease, and other conditions linked to overall health, including heart disease and diabetes.  Please see the attached documents for additional preventive care recommendations.

## 2024-09-03 NOTE — Progress Notes (Signed)
 Subjective:     Subjective:   Jonathan Ingram is a 68 y.o. who presents for a Medicare Wellness preventive visit.  As a reminder, Annual Wellness Visits don't include a physical exam, and some assessments may be limited, especially if this visit is performed virtually. We may recommend an in-person follow-up visit with your provider if needed.  Visit Complete: Virtual I connected with  Leyland M Butcher on 09/03/24 by a video and audio enabled telemedicine application and verified that I am speaking with the correct person using two identifiers.  Patient Location: Home  Provider Location: Home Office  I discussed the limitations of evaluation and management by telemedicine. The patient expressed understanding and agreed to proceed.  Vital Signs: Because this visit was a virtual/telehealth visit, some criteria may be missing or patient reported. Any vitals not documented were not able to be obtained and vitals that have been documented are patient reported.    Persons Participating in Visit: Patient.  AWV Questionnaire: Yes: Patient Medicare AWV questionnaire was completed by the patient on 09/03/24; I have confirmed that all information answered by patient is correct and no changes since this date.  Cardiac Risk Factors include: advanced age (>70men, >19 women);male gender;dyslipidemia;smoking/ tobacco exposure     Objective:    Today's Vitals   09/03/24 0853  Weight: 180 lb (81.6 kg)  Height: 5' 9 (1.753 m)   Body mass index is 26.58 kg/m.     09/03/2024    9:07 AM 08/22/2023   10:06 AM 04/26/2022    1:06 PM 01/26/2016    9:21 AM 01/05/2016    9:22 AM  Advanced Directives  Does Patient Have a Medical Advance Directive? No No No No  No   Would patient like information on creating a medical advance directive? No - Patient declined Yes (MAU/Ambulatory/Procedural Areas - Information given) No - Patient declined Yes - Educational materials given  No - patient declined information       Data saved with a previous flowsheet row definition    Current Medications (verified) Outpatient Encounter Medications as of 09/03/2024  Medication Sig   cholecalciferol (VITAMIN D3) 25 MCG (1000 UNIT) tablet Take 1,000 Units by mouth daily.   fexofenadine (ALLEGRA) 180 MG tablet Take 180 mg by mouth daily.   fluticasone  (FLONASE ) 50 MCG/ACT nasal spray Place 2 sprays into both nostrils daily.   levocetirizine (XYZAL ) 5 MG tablet Take 1 tablet (5 mg total) by mouth every evening.   rosuvastatin  (CRESTOR ) 40 MG tablet TAKE 1 TABLET(40 MG) BY MOUTH DAILY   No facility-administered encounter medications on file as of 09/03/2024.    Allergies (verified) Penicillins   History: Past Medical History:  Diagnosis Date   Allergy    Anxiety    Arthritis 2020   Cataract 2022   Chicken pox    Depression    Elevated TSH 01/30/2018   Hyperlipidemia    Thrombocytopenia    Wears dentures    full lower.  Doesn't wear often.   Past Surgical History:  Procedure Laterality Date   APPENDECTOMY     COLONOSCOPY WITH PROPOFOL  N/A 12/02/2021   Procedure: COLONOSCOPY WITH PROPOFOL ;  Surgeon: Jinny Carmine, MD;  Location: [facility];  Service: Endoscopy;  Laterality: N/A;  needs to be last   NO PAST SURGERIES     POLYPECTOMY N/A 12/02/2021   Procedure: POLYPECTOMY;  Surgeon: Jinny Carmine, MD;  Location: [facility];  Service: Endoscopy;  Laterality: N/A;   Family History  Problem Relation Age of Onset   Hypertension Mother    Bladder Cancer Mother    Clotting disorder Mother    Arthritis Mother    Hyperlipidemia Father    Aneurysm Father    Clotting disorder Brother    Diabetes Daughter    Hypertension Maternal Uncle    Bladder Cancer Maternal Uncle    Leukemia Paternal Uncle    Liver cancer Maternal Grandmother    Arthritis Maternal Aunt    Social History   Socioeconomic History   Marital status: Widowed    Spouse name: Not on file   Number of children: 1    Years of education: Not on file   Highest education level: 12th grade  Occupational History   Occupation: retired  Tobacco Use   Smoking status: Some Days    Current packs/day: 0.25    Average packs/day: 0.4 packs/day for 40.8 years (15.7 ttl pk-yrs)    Types: Cigarettes    Start date: 1976    Last attempt to quit: 2001   Smokeless tobacco: Never   Tobacco comments:    09/03/24 smokes about 1-2 a week  Vaping Use   Vaping status: Never Used  Substance and Sexual Activity   Alcohol use: Not Currently    Alcohol/week: 1.0 standard drink of alcohol    Types: 1 Standard drinks or equivalent per week    Comment: 3-4 beers on the weekends   Drug use: No   Sexual activity: Yes  Other Topics Concern   Not on file  Social History Narrative   Widowed, 1 adult child, 2 step children, 1 granddaughter that lives with him and he raises, mother in law lives with him   Social Drivers of Health   Financial Resource Strain: Medium Risk (09/03/2024)   Overall Financial Resource Strain (CARDIA)    Difficulty of Paying Living Expenses: Somewhat hard  Food Insecurity: No Food Insecurity (09/03/2024)   Hunger Vital Sign    Worried About Running Out of Food in the Last Year: Never true    Ran Out of Food in the Last Year: Never true  Transportation Needs: No Transportation Needs (09/03/2024)   PRAPARE - Administrator, Civil Service (Medical): No    Lack of Transportation (Non-Medical): No  Physical Activity: Sufficiently Active (09/03/2024)   Exercise Vital Sign    Days of Exercise per Week: 5 days    Minutes of Exercise per Session: 30 min  Stress: No Stress Concern Present (09/03/2024)   [employer] of Occupational Health - Occupational Stress Questionnaire    Feeling of Stress: Only a little  Social Connections: Moderately Integrated (09/03/2024)   Social Connection and Isolation Panel    Frequency of Communication with Friends and Family: More than three times a week     Frequency of Social Gatherings with Friends and Family: More than three times a week    Attends Religious Services: More than 4 times per year    Active Member of [employer] or Organizations: Yes    Attends Banker Meetings: More than 4 times per year    Marital Status: Widowed    Tobacco Counseling Ready to quit: Yes Counseling given: Yes Tobacco comments: 09/03/24 smokes about 1-2 a week    Clinical Intake:  Pre-visit preparation completed: Yes  Pain : No/denies pain     BMI - recorded: 26.58 Nutritional Status: BMI 25 -29 Overweight Nutritional Risks: None Diabetes: No  Lab Results  Component Value Date  HGBA1C 5.5 11/06/2023   HGBA1C 5.6 11/03/2022   HGBA1C 5.5 11/01/2021     How often do you need to have someone help you when you read instructions, pamphlets, or other written materials from your doctor or pharmacy?: 1 - Never  Interpreter Needed?: No  Information entered by :: R. Raiden Haydu LPN   Activities of Daily Living     09/03/2024    8:57 AM  In your present state of health, do you have any difficulty performing the following activities:  Hearing? 0  Vision? 0  Difficulty concentrating or making decisions? 0  Walking or climbing stairs? 0  Dressing or bathing? 0  Doing errands, shopping? 0  Preparing Food and eating ? N  Using the Toilet? N  In the past six months, have you accidently leaked urine? N  Do you have problems with loss of bowel control? N  Managing your Medications? N  Managing your Finances? N  Housekeeping or managing your Housekeeping? N    Patient Care Team: Gretel App, NP as PCP - General (Nurse Practitioner) Jinny Carmine, MD as Consulting Physician (Gastroenterology)  I have updated your Care Teams any recent Medical Services you may have received from other providers in the past year.     Assessment:   This is a routine wellness examination for Ewen.  Hearing/Vision screen Hearing Screening - Comments:: No  issues Vision Screening - Comments:: glasses   Goals Addressed             This Visit's Progress    Patient Stated       Wants to quit smoking and walk a little more       Depression Screen     09/03/2024    9:02 AM 08/22/2023   10:04 AM 01/01/2023   11:23 AM 11/03/2022   10:08 AM 04/26/2022    1:04 PM 08/08/2019    4:09 PM 10/14/2018    4:04 PM  PHQ 2/9 Scores  PHQ - 2 Score 0 0 0 0 0 0 0  PHQ- 9 Score 0 0 0        Fall Risk     09/03/2024    8:59 AM 08/22/2023   10:07 AM 01/01/2023   11:22 AM 11/03/2022   10:10 AM 11/03/2022   10:08 AM  Fall Risk   Falls in the past year? 0 0 0 0 0  Number falls in past yr: 0 0 0 0 0  Injury with Fall? 0 0 0 0   Risk for fall due to : No Fall Risks No Fall Risks No Fall Risks No Fall Risks No Fall Risks  Follow up Falls evaluation completed Falls prevention discussed Falls evaluation completed Falls evaluation completed  Falls evaluation completed      Data saved with a previous flowsheet row definition    MEDICARE RISK AT HOME:  Medicare Risk at Home Any stairs in or around the home?: Yes If so, are there any without handrails?: No Home free of loose throw rugs in walkways, pet beds, electrical cords, etc?: Yes Adequate lighting in your home to reduce risk of falls?: Yes Life alert?: No Use of a cane, walker or w/c?: No Grab bars in the bathroom?: No Shower chair or bench in shower?: No Elevated toilet seat or a handicapped toilet?: No  TIMED UP AND GO:  Was the test performed?  No  Cognitive Function: 6CIT completed        09/03/2024    9:09 AM 08/22/2023  10:08 AM  6CIT Screen  What Year? 0 points 0 points  What month? 0 points 0 points  What time? 0 points 0 points  Count back from 20 0 points 0 points  Months in reverse 2 points 0 points  Repeat phrase 0 points 0 points  Total Score 2 points 0 points    Immunizations Immunization History  Administered Date(s) Administered   Fluad Quad(high Dose 65+)  10/19/2021   INFLUENZA, HIGH DOSE SEASONAL PF 10/12/2022   Influenza,inj,Quad PF,6+ Mos 01/30/2018, 12/19/2018, 08/08/2019   Influenza,inj,quad, With Preservative 12/19/2018   Influenza-Unspecified 09/26/2023, 08/15/2024   Moderna Sars-Covid-2 Vaccination 01/31/2020, 02/28/2020, 10/14/2020   PNEUMOCOCCAL CONJUGATE-20 11/01/2021   Pneumococcal Polysaccharide-23 04/10/2016   Tdap 04/10/2016   Zoster Recombinant(Shingrix) 10/29/2023, 08/15/2024    Screening Tests Health Maintenance  Topic Date Due   COVID-19 Vaccine (4 - 2025-26 season) 07/28/2024   Colonoscopy  12/02/2024   Medicare Annual Wellness (AWV)  09/03/2025   DTaP/Tdap/Td (2 - Td or Tdap) 04/10/2026   Pneumococcal Vaccine: 50+ Years  Completed   Influenza Vaccine  Completed   Hepatitis C Screening  Completed   Zoster Vaccines- Shingrix  Completed   Meningococcal B Vaccine  Aged Out   Lung Cancer Screening  Discontinued    Health Maintenance Items Addressed: Patient declines covid vaccine  Additional Screening:  Vision Screening: Recommended annual ophthalmology exams for early detection of glaucoma and other disorders of the eye. Is the patient up to date with their annual eye exam?  Yes  Who is the provider or what is the name of the office in which the patient attends annual eye exams?  [facility]  Dental Screening: Recommended annual dental exams for proper oral hygiene  Community Resource Referral / Chronic Care Management: CRR required this visit?  No   CCM required this visit?  No   Plan:    I have personally reviewed and noted the following in the patient's chart:   Medical and social history Use of alcohol, tobacco or illicit drugs  Current medications and supplements including opioid prescriptions. Patient is not currently taking opioid prescriptions. Functional ability and status Nutritional status Physical activity Advanced directives List of other physicians Hospitalizations, surgeries,  and ER visits in previous 12 months Vitals Screenings to include cognitive, depression, and falls Referrals and appointments  In addition, I have reviewed and discussed with patient certain preventive protocols, quality metrics, and best practice recommendations. A written personalized care plan for preventive services as well as general preventive health recommendations were provided to patient.   Angeline Fredericks, LPN   89/11/7972   After Visit Summary: (MyChart) Due to this being a telephonic visit, the after visit summary with patients personalized plan was offered to patient via MyChart   Notes: Nothing significant to report at this time.

## 2024-09-18 DIAGNOSIS — H25811 Combined forms of age-related cataract, right eye: Secondary | ICD-10-CM | POA: Diagnosis not present

## 2024-09-23 ENCOUNTER — Other Ambulatory Visit: Payer: Self-pay

## 2024-09-23 DIAGNOSIS — E785 Hyperlipidemia, unspecified: Secondary | ICD-10-CM

## 2024-09-23 MED ORDER — ROSUVASTATIN CALCIUM 40 MG PO TABS: 40.0000 mg | ORAL_TABLET | Freq: Every day | ORAL | 3 refills | Status: AC

## 2024-11-11 ENCOUNTER — Ambulatory Visit: Payer: Medicare Other | Admitting: Nurse Practitioner

## 2024-11-11 ENCOUNTER — Encounter: Payer: Self-pay | Admitting: Nurse Practitioner

## 2024-11-11 VITALS — BP 122/70 | HR 60 | Temp 97.6°F | Ht 69.0 in | Wt 185.0 lb

## 2024-11-11 DIAGNOSIS — Z0000 Encounter for general adult medical examination without abnormal findings: Secondary | ICD-10-CM | POA: Diagnosis not present

## 2024-11-11 DIAGNOSIS — E559 Vitamin D deficiency, unspecified: Secondary | ICD-10-CM | POA: Insufficient documentation

## 2024-11-11 DIAGNOSIS — Z131 Encounter for screening for diabetes mellitus: Secondary | ICD-10-CM | POA: Diagnosis not present

## 2024-11-11 DIAGNOSIS — E663 Overweight: Secondary | ICD-10-CM

## 2024-11-11 DIAGNOSIS — R7989 Other specified abnormal findings of blood chemistry: Secondary | ICD-10-CM

## 2024-11-11 DIAGNOSIS — E785 Hyperlipidemia, unspecified: Secondary | ICD-10-CM

## 2024-11-11 DIAGNOSIS — Z125 Encounter for screening for malignant neoplasm of prostate: Secondary | ICD-10-CM | POA: Diagnosis not present

## 2024-11-11 DIAGNOSIS — Z1211 Encounter for screening for malignant neoplasm of colon: Secondary | ICD-10-CM

## 2024-11-11 DIAGNOSIS — R946 Abnormal results of thyroid function studies: Secondary | ICD-10-CM

## 2024-11-11 LAB — CBC WITH DIFFERENTIAL/PLATELET
Basophils Absolute: 0 K/uL (ref 0.0–0.1)
Basophils Relative: 0.7 % (ref 0.0–3.0)
Eosinophils Absolute: 0.1 K/uL (ref 0.0–0.7)
Eosinophils Relative: 2.2 % (ref 0.0–5.0)
HCT: 47.5 % (ref 39.0–52.0)
Hemoglobin: 16.4 g/dL (ref 13.0–17.0)
Lymphocytes Relative: 29.1 % (ref 12.0–46.0)
Lymphs Abs: 1.4 K/uL (ref 0.7–4.0)
MCHC: 34.5 g/dL (ref 30.0–36.0)
MCV: 97.9 fl (ref 78.0–100.0)
Monocytes Absolute: 0.3 K/uL (ref 0.1–1.0)
Monocytes Relative: 7.1 % (ref 3.0–12.0)
Neutro Abs: 2.9 K/uL (ref 1.4–7.7)
Neutrophils Relative %: 60.9 % (ref 43.0–77.0)
Platelets: 150 K/uL (ref 150.0–400.0)
RBC: 4.85 Mil/uL (ref 4.22–5.81)
RDW: 13.2 % (ref 11.5–15.5)
WBC: 4.8 K/uL (ref 4.0–10.5)

## 2024-11-11 LAB — COMPREHENSIVE METABOLIC PANEL WITH GFR
ALT: 25 U/L (ref 0–53)
AST: 20 U/L (ref 5–37)
Albumin: 4.9 g/dL (ref 3.5–5.2)
Alkaline Phosphatase: 72 U/L (ref 39–117)
BUN: 12 mg/dL (ref 6–23)
CO2: 32 meq/L (ref 19–32)
Calcium: 9.3 mg/dL (ref 8.4–10.5)
Chloride: 102 meq/L (ref 96–112)
Creatinine, Ser: 0.97 mg/dL (ref 0.40–1.50)
GFR: 79.73 mL/min (ref >60.00)
Glucose, Bld: 93 mg/dL (ref 70–99)
Potassium: 4.6 meq/L (ref 3.5–5.1)
Sodium: 140 meq/L (ref 135–145)
Total Bilirubin: 1 mg/dL (ref 0.2–1.2)
Total Protein: 6.7 g/dL (ref 6.0–8.3)

## 2024-11-11 LAB — VITAMIN D 25 HYDROXY (VIT D DEFICIENCY, FRACTURES): VITD: 37.77 ng/mL (ref 30.00–100.00)

## 2024-11-11 LAB — PSA, MEDICARE: PSA: 1.37 ng/mL (ref 0.10–4.00)

## 2024-11-11 LAB — LIPID PANEL
Cholesterol: 128 mg/dL (ref 28–200)
HDL: 41.1 mg/dL (ref >39.00)
LDL Cholesterol: 50 mg/dL (ref 0–99)
NonHDL: 86.74
Total CHOL/HDL Ratio: 3
Triglycerides: 184 mg/dL — ABNORMAL HIGH (ref 0.0–149.0)
VLDL: 36.8 mg/dL (ref 0.0–40.0)

## 2024-11-11 LAB — HEMOGLOBIN A1C: Hgb A1c MFr Bld: 5.4 % (ref 4.6–6.5)

## 2024-11-11 LAB — TSH: TSH: 7.26 u[IU]/mL — ABNORMAL HIGH (ref 0.35–5.50)

## 2024-11-11 NOTE — Progress Notes (Signed)
 " Leron Glance, NP-C Phone: [phone]  RENDER Jonathan Ingram is a 68 y.o. male who presents today for annual exam.   Discussed the use of AI scribe software for clinical note transcription with the patient, who gave verbal consent to proceed.  History of Present Illness   Jonathan Ingram is a 68 year old male who presents for an annual physical exam.  He is currently taking Crestor  for cholesterol management and vitamin D  supplementation. He also uses allergy medication as needed.  He underwent cataract surgery approximately one month ago and sees an eye doctor.  He has a history of occasional smoking, primarily when stressed, such as after the recent death of his dog. He does not smoke daily. He reports very seldom alcohol use and denies any other drug use.  He owns seventeen acres of land, which he uses for walking and physical activity. He maintains a diet that is low in red meat, preferring turkey meat, and cooks at home frequently due to financial considerations. He describes his diet as well-balanced, including vegetables. He engages in regular physical activity, primarily walking, and notes that his activity level is higher in the summer months.  He feels better than last year, although he mentions some residual aches. No chest pain, abdominal pain, constipation, diarrhea, dysuria, headaches, dizziness, difficulty swallowing, skin changes, rashes, mood changes, anxiety, or depression. He sleeps well, typically waking once per night to use the bathroom.  He notes a weight gain of four to five pounds since Thanksgiving, attributed to holiday gatherings and less activity due to cold weather.      Tobacco Use History[1]  Medications Ordered Prior to Encounter[2]   ROS see history of present illness  Objective  Physical Exam Vitals:   11/11/24 0827  BP: 122/70  Pulse: 60  Temp: 97.6 F (36.4 C)  SpO2: 96%    BP Readings from Last 3 Encounters:  11/11/24 122/70  01/24/24  110/78  11/06/23 118/68   Wt Readings from Last 3 Encounters:  11/11/24 185 lb (83.9 kg)  09/03/24 180 lb (81.6 kg)  01/24/24 184 lb (83.5 kg)    Physical Exam Constitutional:      General: He is not in acute distress.    Appearance: Normal appearance.  HENT:     Head: Normocephalic.     Right Ear: Tympanic membrane normal.     Left Ear: Tympanic membrane normal.     Nose: Nose normal.     Mouth/Throat:     Mouth: Mucous membranes are moist.     Pharynx: Oropharynx is clear.  Eyes:     Conjunctiva/sclera: Conjunctivae normal.     Pupils: Pupils are equal, round, and reactive to light.  Neck:     Thyroid : No thyromegaly.  Cardiovascular:     Rate and Rhythm: Normal rate and regular rhythm.     Heart sounds: Normal heart sounds.  Pulmonary:     Effort: Pulmonary effort is normal.     Breath sounds: Normal breath sounds.  Abdominal:     General: Abdomen is flat. Bowel sounds are normal.     Palpations: Abdomen is soft. There is no mass.     Tenderness: There is no abdominal tenderness.  Musculoskeletal:        General: Normal range of motion.  Lymphadenopathy:     Cervical: No cervical adenopathy.  Skin:    General: Skin is warm and dry.     Findings: No rash.  Neurological:  General: No focal deficit present.     Mental Status: He is alert.  Psychiatric:        Mood and Affect: Mood normal.        Behavior: Behavior normal.      Assessment/Plan: Please see individual problem list.  Routine general medical examination at a health care facility Assessment & Plan: Physical exam complete. We will check lab work as outlined. Colonoscopy is due in January. Prostate screening ordered today in lab work. Flu and tetanus vaccines are up to date. He declines additional COVID vaccines. Shingles and pneumonia vaccine series completed. Recent cataract surgery was successful. Sent referral to GI for colonoscopy, encouraged to schedule this. Continue routine dental and eye  exams. Encourage healthy diet and regular exercise. Return to care in one year, sooner as needed.   Orders: -     CBC with Differential/Platelet  Hyperlipidemia, unspecified hyperlipidemia type Assessment & Plan: Well controlled on Crestor  40 mg daily with no reported side effects. Continue Crestor .  Orders: -     Lipid panel -     Comprehensive metabolic panel with GFR  Elevated TSH Assessment & Plan: Check TSH. If remaining elevated, consider starting on Levothyroxine .   Orders: -     TSH  Vitamin D  deficiency Assessment & Plan: Managed with daily OTC supplementation. Continue. Check vitamin D  level.   Orders: -     VITAMIN D  25 Hydroxy (Vit-D Deficiency, Fractures)  Overweight (BMI 25.0-29.9) -     Hemoglobin A1c  Screening PSA (prostate specific antigen) -     PSA, Medicare  Screen for colon cancer -     Ambulatory referral to Gastroenterology     Return in about 1 year (around 11/11/2025) for Annual Exam, sooner as needed.   Leron Glance, NP-C [facility]     [1]  Social History Tobacco Use  Smoking Status Some Days   Current packs/day: 0.25   Average packs/day: 0.4 packs/day for 41.0 years (15.7 ttl pk-yrs)   Types: Cigarettes   Start date: 1976   Last attempt to quit: 2001  Smokeless Tobacco Never  Tobacco Comments   09/03/24 smokes about 1-2 a week  [2]  Current Outpatient Medications on File Prior to Visit  Medication Sig Dispense Refill   cholecalciferol (VITAMIN D3) 25 MCG (1000 UNIT) tablet Take 1,000 Units by mouth daily.     fexofenadine (ALLEGRA) 180 MG tablet Take 180 mg by mouth daily.     fluticasone  (FLONASE ) 50 MCG/ACT nasal spray Place 2 sprays into both nostrils daily. 16 g 6   levocetirizine (XYZAL ) 5 MG tablet Take 1 tablet (5 mg total) by mouth every evening. 90 tablet 3   rosuvastatin  (CRESTOR ) 40 MG tablet Take 1 tablet (40 mg total) by mouth daily. 90 tablet 3   No current facility-administered  medications on file prior to visit.   "

## 2024-11-18 ENCOUNTER — Ambulatory Visit: Payer: Self-pay | Admitting: Nurse Practitioner

## 2024-11-18 DIAGNOSIS — E039 Hypothyroidism, unspecified: Secondary | ICD-10-CM

## 2024-11-18 MED ORDER — LEVOTHYROXINE SODIUM 50 MCG PO TABS: 50.0000 ug | ORAL_TABLET | Freq: Every day | ORAL | 0 refills | Status: AC

## 2024-11-18 NOTE — Telephone Encounter (Signed)
 Copied from CRM #[id number]. Topic: Clinical - Lab/Test Results >> Nov 18, 2024  3:51 PM Franky GRADE wrote: Reason for CRM: Patient is returning a call from  [facility], advised of the lab message. Patient understood and will start taking the medication. He also scheduled the lab appointment for 01/13/2025

## 2024-11-24 ENCOUNTER — Encounter: Payer: Self-pay | Admitting: Nurse Practitioner

## 2024-11-24 NOTE — Assessment & Plan Note (Signed)
 Physical exam complete. We will check lab work as outlined. Colonoscopy is due in January. Prostate screening ordered today in lab work. Flu and tetanus vaccines are up to date. He declines additional COVID vaccines. Shingles and pneumonia vaccine series completed. Recent cataract surgery was successful. Sent referral to GI for colonoscopy, encouraged to schedule this. Continue routine dental and eye exams. Encourage healthy diet and regular exercise. Return to care in one year, sooner as needed.

## 2024-11-24 NOTE — Assessment & Plan Note (Signed)
 Well controlled on Crestor 40 mg daily with no reported side effects. Continue Crestor.

## 2024-11-24 NOTE — Assessment & Plan Note (Signed)
 Managed with daily OTC supplementation. Continue. Check vitamin D  level.

## 2024-11-24 NOTE — Assessment & Plan Note (Signed)
 Check TSH. If remaining elevated, consider starting on Levothyroxine .

## 2025-01-13 ENCOUNTER — Other Ambulatory Visit

## 2025-09-04 ENCOUNTER — Ambulatory Visit

## 2025-11-12 ENCOUNTER — Encounter: Admitting: Nurse Practitioner
